# Patient Record
Sex: Male | Born: 1995 | Race: Black or African American | Hispanic: No | Marital: Single | Smoking: Never smoker
Health system: Southern US, Community
[De-identification: ages and names within clinical notes are randomized; demographics above are authoritative.]

## PROBLEM LIST (undated history)

## (undated) HISTORY — PX: ARTHROSCOPIC REPAIR PCL: SUR81

---

## 2019-02-07 ENCOUNTER — Emergency Department (HOSPITAL_COMMUNITY)
Admission: EM | Admit: 2019-02-07 | Discharge: 2019-02-07 | Disposition: A | Discharge status: Home / Self Care | Payer: Self-pay | Attending: Emergency Medicine | Admitting: Emergency Medicine

## 2019-02-07 ENCOUNTER — Other Ambulatory Visit: Payer: Self-pay

## 2019-02-07 ENCOUNTER — Encounter (HOSPITAL_COMMUNITY): Payer: Self-pay | Admitting: Emergency Medicine

## 2019-02-07 DIAGNOSIS — Z209 Contact with and (suspected) exposure to unspecified communicable disease: Secondary | ICD-10-CM | POA: Insufficient documentation

## 2019-02-07 DIAGNOSIS — J069 Acute upper respiratory infection, unspecified: Secondary | ICD-10-CM | POA: Insufficient documentation

## 2019-02-07 DIAGNOSIS — B9789 Other viral agents as the cause of diseases classified elsewhere: Secondary | ICD-10-CM

## 2019-02-07 NOTE — Discharge Instructions (Addendum)
Your vital signs are within normal limits at this time.  Your history and your examination suggest an upper respiratory infection with cough.  The recommendation from the Centers for Disease Control is that you quarantine yourself over the next 10 to 14 days.  Please wash hands frequently.  Use your mask until symptoms have resolved.  Monitor your temperature closely.  Please see your doctor or return to the emergency department if any difficulty with breathing, fever that would not respond to Tylenol or ibuprofen, excessive weakness, worsening of symptoms, problems or concerns.

## 2019-02-07 NOTE — ED Provider Notes (Signed)
[facility] EMERGENCY DEPARTMENT Provider Note   CSN: [id number] Arrival date & time: 02/07/19  1247    History   Chief Complaint Chief Complaint  Patient presents with  . Cough    HPI Justin Vance is a 23 y.o. male.     The history is provided by the patient.  Cough  Cough characteristics:  Productive Sputum characteristics:  Yellow Severity:  Moderate Onset quality:  Gradual Duration:  6 days Timing:  Intermittent Progression:  Worsening Chronicity:  New Smoker: no   Context: sick contacts   Relieved by:  Nothing Worsened by:  Nothing Ineffective treatments:  None tried Associated symptoms: fever, myalgias and sinus congestion   Associated symptoms: no chest pain, no chills, no eye discharge, no shortness of breath, no sore throat and no wheezing   Associated symptoms comment:  Chest pressure Risk factors: no recent travel     History reviewed. No pertinent past medical history.  There are no active problems to display for this patient.   Past Surgical History:  Procedure Laterality Date  . ARTHROSCOPIC REPAIR PCL          Home Medications    Prior to Admission medications   Not on File    Family History History reviewed. No pertinent family history.  Social History Social History   Tobacco Use  . Smoking status: Never Smoker  . Smokeless tobacco: Never Used  Substance Use Topics  . Alcohol use: Never    Frequency: Never  . Drug use: Never     Allergies   Patient has no known allergies.   Review of Systems Review of Systems  Constitutional: Positive for fever. Negative for activity change and chills.       All ROS Neg except as noted in HPI  HENT: Positive for congestion. Negative for nosebleeds and sore throat.   Eyes: Negative for photophobia and discharge.  Respiratory: Positive for cough. Negative for shortness of breath and wheezing.   Cardiovascular: Negative for chest pain and palpitations.  Gastrointestinal: Positive for  nausea. Negative for abdominal pain, blood in stool, diarrhea and vomiting.  Genitourinary: Negative for dysuria, frequency and hematuria.  Musculoskeletal: Positive for myalgias. Negative for arthralgias, back pain and neck pain.  Skin: Negative.   Neurological: Negative for dizziness, seizures and speech difficulty.  Psychiatric/Behavioral: Negative for confusion and hallucinations.     Physical Exam Updated Vital Signs BP 122/71 (BP Location: Left Arm)   Pulse 88   Temp 98.4 F (36.9 C) (Oral)   Resp 16   Ht 5\' 11"  (1.803 m)   Wt 71.7 kg   SpO2 100%   BMI 22.04 kg/m   Physical Exam Vitals signs and nursing note reviewed.  Constitutional:      Appearance: He is well-developed. He is not toxic-appearing.  HENT:     Head: Normocephalic.     Right Ear: Tympanic membrane and external ear normal.     Left Ear: Tympanic membrane and external ear normal.     Nose: Congestion present.  Eyes:     General: Lids are normal.     Pupils: Pupils are equal, round, and reactive to light.  Neck:     Musculoskeletal: Normal range of motion and neck supple.     Vascular: No carotid bruit.  Cardiovascular:     Rate and Rhythm: Normal rate and regular rhythm.     Pulses: Normal pulses.     Heart sounds: Normal heart sounds.  Pulmonary:  Effort: No respiratory distress.     Breath sounds: Normal breath sounds.  Abdominal:     General: Bowel sounds are normal.     Palpations: Abdomen is soft.     Tenderness: There is no abdominal tenderness. There is no guarding.  Musculoskeletal: Normal range of motion.  Lymphadenopathy:     Head:     Right side of head: No submandibular adenopathy.     Left side of head: No submandibular adenopathy.     Cervical: No cervical adenopathy.  Skin:    General: Skin is warm and dry.  Neurological:     Mental Status: He is alert and oriented to person, place, and time.     Cranial Nerves: No cranial nerve deficit.     Sensory: No sensory deficit.   Psychiatric:        Speech: Speech normal.      ED Treatments / Results  Labs (all labs ordered are listed, but only abnormal results are displayed) Labs Reviewed - No data to display  EKG None  Radiology No results found.  Procedures Procedures (including critical care time)  Medications Ordered in ED Medications - No data to display   Initial Impression / Assessment and Plan / ED Course  I have reviewed the triage vital signs and the nursing notes.  Pertinent labs & imaging results that were available during my care of the patient were reviewed by me and considered in my medical decision making (see chart for details).          Final Clinical Impressions(s) / ED Diagnoses MDM  Patient gives history of having had nasal congestion and cough.  He is not measured a temperature elevation at this time.  He says that he this is been going on over the last 6 days.  I have discussed the findings on his examination with the patient in terms of which he understands.  I asked the patient to self quarantine over the next 10 to 14 days.  We discussed the importance of using his mask until symptoms have resolved.  We have also discussed the importance of good hydration.  The patient will use Tylenol every 4 hours or ibuprofen every 6 hours for fever, and/or aching.  Patient is to return to the emergency department immediately if any changes in his condition, worsening of his symptoms, problems, or concerns.   Final diagnoses:  Viral URI with cough    ED Discharge Orders    None       Ivery Quale, PA-C 02/08/19 3154    Pricilla Loveless, MD 02/08/19 [phone]

## 2019-02-07 NOTE — ED Triage Notes (Signed)
PT c/o cough with productive yellow sputum cough with no fever and nasal congestion x6 days. PT states he works at [employer] center and was told he needed to see a doctor to get a work note to be off work with pay.

## 2019-02-19 ENCOUNTER — Telehealth: Payer: Self-pay | Admitting: General Practice

## 2019-02-19 NOTE — Telephone Encounter (Signed)
Pt. Calling to ask if [facility] is performing corona virus testing. Told that [facility] is currently not testing. States his employer sent him home because he was "fatigued and they told me to get tested." States he has some "congestion in the back of my throat and I feel sick." Reassured pt. He could go to an UC for treatment if he feels ill. Verbalizes understanding.

## 2019-02-23 ENCOUNTER — Encounter (HOSPITAL_COMMUNITY): Payer: Self-pay | Admitting: Emergency Medicine

## 2019-02-23 ENCOUNTER — Emergency Department (HOSPITAL_COMMUNITY)
Admission: EM | Admit: 2019-02-23 | Discharge: 2019-02-23 | Disposition: A | Discharge status: Home / Self Care | Payer: Self-pay | Attending: Emergency Medicine | Admitting: Emergency Medicine

## 2019-02-23 ENCOUNTER — Other Ambulatory Visit: Payer: Self-pay

## 2019-02-23 DIAGNOSIS — B9789 Other viral agents as the cause of diseases classified elsewhere: Secondary | ICD-10-CM

## 2019-02-23 DIAGNOSIS — J069 Acute upper respiratory infection, unspecified: Secondary | ICD-10-CM | POA: Insufficient documentation

## 2019-02-23 NOTE — Discharge Instructions (Addendum)
You have minimal nasal congestion present.  Please use the decongesting medication of your choice to assist with this.  There is mild increased redness of the back of your throat.  Please use salt water gargles 2 or 3 times daily.  Use Tylenol every 4 hours or ibuprofen every 6 hours if needed for aching.  Please use your mask until the symptoms have resolved.  Wash your hands frequently.  Wipe off surfaces as needed.  Please see work note.

## 2019-02-23 NOTE — ED Provider Notes (Signed)
[facility] EMERGENCY DEPARTMENT Provider Note   CSN: [id number] Arrival date & time: 02/23/19  1437    History   Chief Complaint Chief Complaint  Patient presents with  . Cough  . Sore Throat    HPI Justin Vance is a 23 y.o. male.     Patient is a 23 year old male who presents to the emergency department with a complaint of continued cough and congestion.  The patient was seen in the emergency department approximately 2 to 3 weeks ago.  At which time he had upper respiratory symptoms.  He was quarantined for 14 days.  He states that on the 14th day when he went back to work he was still coughing and bringing up thick yellowish phlegm.  He also had problems with some sore throat.Marland Kitchen  His job sent him home again.  The patient states that the following day he felt very bad, but 2 days after that his return to work he felt back to himself.  He has now only minimal cough.  His throat is much better.  He is eating and drinking with minimal problem.  He does continue to have some mild nasal congestion.  The history is provided by the patient.  Cough  Associated symptoms: sore throat   Associated symptoms: no chest pain, no eye discharge, no shortness of breath and no wheezing   Sore Throat  Pertinent negatives include no chest pain, no abdominal pain and no shortness of breath.    History reviewed. No pertinent past medical history.  There are no active problems to display for this patient.   Past Surgical History:  Procedure Laterality Date  . ARTHROSCOPIC REPAIR PCL          Home Medications    Prior to Admission medications   Not on File    Family History History reviewed. No pertinent family history.  Social History Social History   Tobacco Use  . Smoking status: Never Smoker  . Smokeless tobacco: Never Used  Substance Use Topics  . Alcohol use: Never    Frequency: Never  . Drug use: Never     Allergies   Patient has no known allergies.   Review of  Systems Review of Systems  Constitutional: Negative for activity change.       All ROS Neg except as noted in HPI  HENT: Positive for congestion, postnasal drip and sore throat. Negative for nosebleeds.   Eyes: Negative for photophobia and discharge.  Respiratory: Negative for cough, shortness of breath and wheezing.   Cardiovascular: Negative for chest pain and palpitations.  Gastrointestinal: Negative for abdominal pain and blood in stool.  Genitourinary: Negative for dysuria, frequency and hematuria.  Musculoskeletal: Negative for arthralgias, back pain and neck pain.  Skin: Negative.   Neurological: Negative for dizziness, seizures and speech difficulty.  Psychiatric/Behavioral: Negative for confusion and hallucinations.     Physical Exam Updated Vital Signs BP 132/90 (BP Location: Right Arm)   Pulse 60   Temp 98.6 F (37 C) (Oral)   Resp 14   Ht 5\' 11"  (1.803 m)   Wt 72 kg   SpO2 100%   BMI 22.14 kg/m   Physical Exam Vitals signs and nursing note reviewed.  Constitutional:      Appearance: He is well-developed. He is not toxic-appearing.  HENT:     Head: Normocephalic.     Comments: There is nasal congestion present.  There is mild increased redness of the posterior pharynx.  No exudate appreciated.  The airway is patent.  Uvula minimally swollen, but in the midline.    Right Ear: Tympanic membrane and external ear normal.     Left Ear: Tympanic membrane and external ear normal.     Nose: Congestion present.  Eyes:     General: Lids are normal.     Pupils: Pupils are equal, round, and reactive to light.  Neck:     Musculoskeletal: Normal range of motion and neck supple.     Vascular: No carotid bruit.  Cardiovascular:     Rate and Rhythm: Normal rate and regular rhythm.     Pulses: Normal pulses.     Heart sounds: Normal heart sounds.  Pulmonary:     Effort: No respiratory distress.     Breath sounds: Normal breath sounds.  Abdominal:     General: Bowel  sounds are normal.     Palpations: Abdomen is soft.     Tenderness: There is no abdominal tenderness. There is no guarding.  Musculoskeletal: Normal range of motion.  Lymphadenopathy:     Head:     Right side of head: No submandibular adenopathy.     Left side of head: No submandibular adenopathy.     Cervical: No cervical adenopathy.  Skin:    General: Skin is warm and dry.  Neurological:     Mental Status: He is alert and oriented to person, place, and time.     Cranial Nerves: No cranial nerve deficit.     Sensory: No sensory deficit.  Psychiatric:        Speech: Speech normal.      ED Treatments / Results  Labs (all labs ordered are listed, but only abnormal results are displayed) Labs Reviewed - No data to display  EKG None  Radiology No results found.  Procedures Procedures (including critical care time)  Medications Ordered in ED Medications - No data to display   Initial Impression / Assessment and Plan / ED Course  I have reviewed the triage vital signs and the nursing notes.  Pertinent labs & imaging results that were available during my care of the patient were reviewed by me and considered in my medical decision making (see chart for details).          Final Clinical Impressions(s) / ED Diagnoses MDM  Vital signs within normal limits.  Pulse oximetry is 100% on room air.  Within normal limits by my interpretation.  The examination shows minimal nasal congestion present.  The patient has some mild increased redness of the posterior pharynx.  He is awake and alert and ambulatory without problem.  The patient has quarantined for 14 and then 4 another 4 days.  I have asked the patient to use medications for his symptoms.  We will give him a note to return to work on April 15.  The patient is advised however that if he has fever, chills, shortness of breath, chest discomfort, or worsening of his general condition, that he should come back to the emergency  department and not return to work.  We discussed the importance of continued good handwashing and good hydration.  We also discussed the need to continue the use of his mask.  Patient is in agreement with these plans.   Final diagnoses:  Viral URI with cough    ED Discharge Orders    None       Ivery QualeBryant, Tona Qualley, PA-C 02/23/19 1611    Benjiman CorePickering, Nathan, MD 02/23/19 2226

## 2019-02-23 NOTE — ED Triage Notes (Signed)
Pt states that he has been having chills cough sore throat he was dx with a uri on 02/07/2019

## 2019-04-27 ENCOUNTER — Emergency Department (HOSPITAL_COMMUNITY)
Admission: EM | Admit: 2019-04-27 | Discharge: 2019-04-27 | Disposition: A | Discharge status: Home / Self Care | Payer: Self-pay | Attending: Emergency Medicine | Admitting: Emergency Medicine

## 2019-04-27 ENCOUNTER — Other Ambulatory Visit: Payer: Self-pay

## 2019-04-27 DIAGNOSIS — R1084 Generalized abdominal pain: Secondary | ICD-10-CM | POA: Insufficient documentation

## 2019-04-27 DIAGNOSIS — K5900 Constipation, unspecified: Secondary | ICD-10-CM | POA: Insufficient documentation

## 2019-04-27 MED ORDER — FAMOTIDINE 20 MG PO TABS
20.0000 mg | ORAL_TABLET | Freq: Once | ORAL | Status: AC
  Administered 2019-04-27: 20 mg via ORAL
  Filled 2019-04-27: qty 1

## 2019-04-27 MED ORDER — MAGNESIUM HYDROXIDE 400 MG/5ML PO SUSP
30.0000 mL | Freq: Once | ORAL | Status: AC
  Administered 2019-04-27: 30 mL via ORAL
  Filled 2019-04-27: qty 30

## 2019-04-27 MED ORDER — POLYETHYLENE GLYCOL 3350 17 G PO PACK: 17.0000 g | PACK | Freq: Every day | ORAL | 0 refills | Status: DC

## 2019-04-27 MED ORDER — PANTOPRAZOLE SODIUM 40 MG PO TBEC
40.0000 mg | DELAYED_RELEASE_TABLET | Freq: Once | ORAL | Status: AC
  Administered 2019-04-27: 40 mg via ORAL
  Filled 2019-04-27: qty 1

## 2019-04-27 NOTE — ED Provider Notes (Signed)
[facility] EMERGENCY DEPARTMENT Provider Note   CSN: [id number] Arrival date & time: 04/27/19  7001     History   Chief Complaint Chief Complaint  Patient presents with  . Abdominal Pain    HPI Justin Vance is a 23 y.o. male.     Patient is a 23 year old male who presents to the emergency department with abdominal pain.  The patient states that this problem started Friday, June 12.  The patient states that prior to that time he is not been eating a consistent diet.  He he says that on Friday he had some very minor pain in his abdomen.  He had a couple of pieces of pizza bread stick, but that was pretty much it as far as his dinner was concerned.  Later during the night he had increased bubbling of his stomach.  This bubbling continued on Sunday and was accompanied by an occasional cramp.  On today, June 15 the patient states that not only is he having bubbling of his stomach but he is having more severe cramping.  No vomiting reported.  No fever noted.  He had a mushy stool on 21 April 2011, but has not had a bowel movement since that time.  He does not have a history of irritable bowel syndrome.  He admits that he does not drink much water.  He has not had injury to the abdomen and no recent operations or procedures.  No blood in the stool reported, no black or tarry looking stool reported.     No past medical history on file.  There are no active problems to display for this patient.   Past Surgical History:  Procedure Laterality Date  . ARTHROSCOPIC REPAIR PCL          Home Medications    Prior to Admission medications   Not on File    Family History No family history on file.  Social History Social History   Tobacco Use  . Smoking status: Never Smoker  . Smokeless tobacco: Never Used  Substance Use Topics  . Alcohol use: Never    Frequency: Never  . Drug use: Never     Allergies   Patient has no known allergies.   Review of Systems Review of Systems   Constitutional: Negative for activity change.       All ROS Neg except as noted in HPI  HENT: Negative for nosebleeds.   Eyes: Negative for photophobia and discharge.  Respiratory: Negative for cough, shortness of breath and wheezing.   Cardiovascular: Negative for chest pain and palpitations.  Gastrointestinal: Positive for abdominal pain and constipation. Negative for blood in stool.  Genitourinary: Negative for dysuria, frequency and hematuria.  Musculoskeletal: Negative for arthralgias, back pain and neck pain.  Skin: Negative.   Neurological: Negative for dizziness, seizures and speech difficulty.  Psychiatric/Behavioral: Negative for confusion and hallucinations.     Physical Exam Updated Vital Signs BP 119/78 (BP Location: Right Arm)   Pulse (!) 50   Temp 98 F (36.7 C) (Oral)   Resp 18   Ht 5' 10.5" (1.791 m)   Wt 68 kg   SpO2 100%   BMI 21.22 kg/m   Physical Exam Vitals signs and nursing note reviewed.  Constitutional:      Appearance: He is well-developed. He is not toxic-appearing.  HENT:     Head: Normocephalic.     Right Ear: Tympanic membrane and external ear normal.     Left Ear: Tympanic membrane and  external ear normal.  Eyes:     General: Lids are normal.     Pupils: Pupils are equal, round, and reactive to light.  Neck:     Musculoskeletal: Normal range of motion and neck supple.     Vascular: No carotid bruit.  Cardiovascular:     Rate and Rhythm: Normal rate and regular rhythm.     Pulses: Normal pulses.     Heart sounds: Normal heart sounds.  Pulmonary:     Effort: No respiratory distress.     Breath sounds: Normal breath sounds.  Abdominal:     General: Bowel sounds are normal.     Palpations: Abdomen is soft.     Tenderness: There is generalized abdominal tenderness. There is no right CVA tenderness, left CVA tenderness or guarding. Negative signs include Murphy's sign, McBurney's sign and psoas sign.     Comments: Mild generalized  tenderness.  Increase gas throughout the abdomen.  No pain with flexion of the psoas muscle.  No pain with walking.  No CVA tenderness.  Musculoskeletal: Normal range of motion.  Lymphadenopathy:     Head:     Right side of head: No submandibular adenopathy.     Left side of head: No submandibular adenopathy.     Cervical: No cervical adenopathy.  Skin:    General: Skin is warm and dry.  Neurological:     Mental Status: He is alert and oriented to person, place, and time.     Cranial Nerves: No cranial nerve deficit.     Sensory: No sensory deficit.  Psychiatric:        Speech: Speech normal.      ED Treatments / Results  Labs (all labs ordered are listed, but only abnormal results are displayed) Labs Reviewed - No data to display  EKG    Radiology No results found.  Procedures Procedures (including critical care time)  Medications Ordered in ED Medications - No data to display   Initial Impression / Assessment and Plan / ED Course  I have reviewed the triage vital signs and the nursing notes.  Pertinent labs & imaging results that were available during my care of the patient were reviewed by me and considered in my medical decision making (see chart for details).          Final Clinical Impressions(s) / ED Diagnoses MDM  Vital signs are within normal limits.  Pulse oximetry is 99 to 100% on room air.  Patient is awake and alert and in no distress.  Patient is amatory without pain.  There is mild to moderate generalized pain accompanied by increased bubbling of his stomach and extra gas.  There is no pain with flexing of the so as, walking, or other signs of surgical abdomen.  The patient is treated with milk of magnesia here in the emergency department for suspected constipation.  The patient is also given Pepcid and Protonix here in the emergency department.  Prescription for MiraLAX is given.  Patient is asked to use Pepcid twice daily before his meals.   Patient is asked to see his primary physician or return to the emergency department if any changes in his condition, problems, or concerns.   Final diagnoses:  Generalized abdominal pain  Constipation, unspecified constipation type    ED Discharge Orders         Ordered    polyethylene glycol (MIRALAX / GLYCOLAX) 17 g packet  Daily     04/27/19 1035  Ivery QualeBryant, Manveer Gomes, PA-C 04/27/19 2129    Vanetta MuldersZackowski, Scott, MD 04/28/19 [phone]

## 2019-04-27 NOTE — Discharge Instructions (Addendum)
Your vital signs within normal limits.  Your oxygen level is 100% on room air.  Please increase water, juices, Gatorade, etc.  Please use MiraLAX, 1 packet, or 17 g daily in water or juice to better regulate your bowels.  Please use Pepcid 20 mg 2 times daily before breakfast and before your evening meal.  Please see your primary physician or return to the emergency department immediately if any high fever, excessive vomiting, blood in your stool, worsening of your symptoms, changes in your condition, problems, or concerns.

## 2019-04-27 NOTE — ED Triage Notes (Signed)
Presents with abdominal pain described as cramping that began this AM. He states, "It felt like I had to have a BM" He has not had a BM since Friday. He usually goes daily.

## 2019-04-27 NOTE — ED Notes (Signed)
Unable to sign due to e signature problem

## 2020-08-10 ENCOUNTER — Telehealth (HOSPITAL_COMMUNITY): Payer: Self-pay | Admitting: Emergency Medicine

## 2020-08-10 ENCOUNTER — Emergency Department (HOSPITAL_COMMUNITY): Payer: Self-pay

## 2020-08-10 ENCOUNTER — Other Ambulatory Visit: Payer: Self-pay

## 2020-08-10 ENCOUNTER — Emergency Department (HOSPITAL_COMMUNITY)
Admission: EM | Admit: 2020-08-10 | Discharge: 2020-08-10 | Disposition: A | Discharge status: Home / Self Care | Payer: Self-pay | Attending: Emergency Medicine | Admitting: Emergency Medicine

## 2020-08-10 ENCOUNTER — Encounter (HOSPITAL_COMMUNITY): Payer: Self-pay

## 2020-08-10 DIAGNOSIS — N5089 Other specified disorders of the male genital organs: Secondary | ICD-10-CM | POA: Insufficient documentation

## 2020-08-10 DIAGNOSIS — N50812 Left testicular pain: Secondary | ICD-10-CM

## 2020-08-10 DIAGNOSIS — N3000 Acute cystitis without hematuria: Secondary | ICD-10-CM

## 2020-08-10 DIAGNOSIS — N50819 Testicular pain, unspecified: Secondary | ICD-10-CM

## 2020-08-10 LAB — URINALYSIS, ROUTINE W REFLEX MICROSCOPIC
Bacteria, UA: NONE SEEN
Bilirubin Urine: NEGATIVE
Glucose, UA: NEGATIVE mg/dL
Hgb urine dipstick: NEGATIVE
Ketones, ur: NEGATIVE mg/dL
Nitrite: NEGATIVE
Protein, ur: 30 mg/dL — AB
Specific Gravity, Urine: 1.028 (ref 1.005–1.030)
WBC, UA: >50 WBC/hpf — ABNORMAL HIGH (ref 0–5)
pH: 6 (ref 5.0–8.0)

## 2020-08-10 MED ORDER — HYDROCODONE-ACETAMINOPHEN 5-325 MG PO TABS: 2.0000 | ORAL_TABLET | ORAL | 0 refills | Status: DC | PRN

## 2020-08-10 MED ORDER — DOXYCYCLINE HYCLATE 100 MG PO CAPS: 100.0000 mg | ORAL_CAPSULE | Freq: Two times a day (BID) | ORAL | 0 refills | Status: DC

## 2020-08-10 MED ORDER — CEFTRIAXONE SODIUM 500 MG IJ SOLR
500.0000 mg | Freq: Once | INTRAMUSCULAR | Status: AC
  Administered 2020-08-10: 500 mg via INTRAMUSCULAR
  Filled 2020-08-10: qty 500

## 2020-08-10 MED ORDER — LIDOCAINE HCL (PF) 1 % IJ SOLN
INTRAMUSCULAR | Status: AC
  Filled 2020-08-10: qty 30

## 2020-08-10 MED ORDER — HYDROCODONE-ACETAMINOPHEN 5-325 MG PO TABS: 1.0000 | ORAL_TABLET | ORAL | 0 refills | Status: DC | PRN

## 2020-08-10 NOTE — ED Provider Notes (Signed)
[facility] EMERGENCY DEPARTMENT Provider Note   CSN: [id number] Arrival date & time: 08/10/20  1131     History Chief Complaint  Patient presents with  . Testicle Pain    Melroy Bougher is a 24 y.o. male.  Patient is a 24 year old male with no significant past medical history.  He presents today for evaluation of pain and swelling in his left testicle.  He denies any specific injury or trauma.  This has been worsening over the past 2 weeks.  He was seen by an outside clinic and prescribed Macrodantin for UTI.  He denies to me he is experiencing urethral discharge.  He does report occasional burning when he urinates.  He denies any fevers or chills.  He denies any new sexual contacts or exposures.  The history is provided by the patient.  Testicle Pain This is a new problem. Episode onset: Weeks ago. The problem occurs constantly. The problem has been gradually worsening. The symptoms are aggravated by walking (Palpation). Nothing relieves the symptoms. He has tried nothing for the symptoms.       History reviewed. No pertinent past medical history.  There are no problems to display for this patient.   Past Surgical History:  Procedure Laterality Date  . ARTHROSCOPIC REPAIR PCL         No family history on file.  Social History   Tobacco Use  . Smoking status: Never Smoker  . Smokeless tobacco: Never Used  Vaping Use  . Vaping Use: Never used  Substance Use Topics  . Alcohol use: Never  . Drug use: Never    Home Medications Prior to Admission medications   Medication Sig Start Date End Date Taking? Authorizing Provider  polyethylene glycol (MIRALAX / GLYCOLAX) 17 g packet Take 17 g by mouth daily. 04/27/19   Ivery Quale, PA-C    Allergies    Patient has no known allergies.  Review of Systems   Review of Systems  Genitourinary: Positive for testicular pain.  All other systems reviewed and are negative.   Physical Exam Updated Vital Signs BP 128/62    Pulse 71   Temp (!) 97 F (36.1 C) (Oral)   Resp 16   Ht 5\' 10"  (1.778 m)   Wt 65.8 kg   SpO2 100%   BMI 20.81 kg/m   Physical Exam Vitals and nursing note reviewed.  Constitutional:      General: He is not in acute distress.    Appearance: Normal appearance. He is not ill-appearing, toxic-appearing or diaphoretic.  HENT:     Head: Normocephalic and atraumatic.  Pulmonary:     Effort: Pulmonary effort is normal.  Genitourinary:    Penis: Normal.      Comments: Both testicles appear swollen and tender, the left greater than the right.  They are freely mobile within the scrotum.  There is no urethral discharge or lesions of the penis or testicles. Skin:    General: Skin is warm and dry.  Neurological:     Mental Status: He is alert and oriented to person, place, and time.     ED Results / Procedures / Treatments   Labs (all labs ordered are listed, but only abnormal results are displayed) Labs Reviewed  URINALYSIS, ROUTINE W REFLEX MICROSCOPIC  GC/CHLAMYDIA PROBE AMP ([place]) NOT AT [facility]    EKG None  Radiology No results found.  Procedures Procedures (including critical care time)  Medications Ordered in ED Medications - No data to display  ED  Course  I have reviewed the triage vital signs and the nursing notes.  Pertinent labs & imaging results that were available during my care of the patient were reviewed by me and considered in my medical decision making (see chart for details).    MDM Rules/Calculators/A&P  Patient is a 24 year old male presenting with complaints of testicular pain and swelling.  He was seen earlier this week with similar complaints at an outside facility and prescribed Macrodantin, however this is not helping.  Patient's testicles are swollen and tender, but freely mobile.  There is no evidence for torsion on the ultrasound.  It does show moderate bilateral hydroceles, with the left showing multiple septations raising question of  prior hemorrhage or infection.  Patient does have evidence for UTI in his urine and I favor an infectious etiology.  Patient will be given IM Rocephin and discharged with a several week course of doxycycline.  I have also advised him to follow-up with urology in the next week, and return to the ER if symptoms worsen or change.  Final Clinical Impression(s) / ED Diagnoses Final diagnoses:  None    Rx / DC Orders ED Discharge Orders    None       Geoffery Lyons, MD 08/10/20 1430

## 2020-08-10 NOTE — Discharge Instructions (Addendum)
Begin taking doxycycline as prescribed.  Take hydrocodone as prescribed as needed for pain.  We will call you if your cultures indicate you require further treatment or action.  You should follow-up with urology in the next week.  The contact information for [facility] in [place] has been provided in this discharge summary for you to call and make these arrangements.  Return to the ER in the meantime if symptoms significantly worsen or change.

## 2020-08-10 NOTE — ED Triage Notes (Signed)
Pt presents to ED with testicle pain x couple weeks and swelling. Pt states he seen his PCP and was treated for UTI and then went back but was unable to "afford the other tests they wanted done."

## 2020-08-11 LAB — GC/CHLAMYDIA PROBE AMP (~~LOC~~) NOT AT ARMC
Chlamydia: POSITIVE — AB
Comment: NEGATIVE
Comment: NORMAL
Neisseria Gonorrhea: NEGATIVE

## 2020-09-18 ENCOUNTER — Encounter (HOSPITAL_COMMUNITY): Payer: Self-pay | Admitting: *Deleted

## 2020-09-18 ENCOUNTER — Emergency Department (HOSPITAL_COMMUNITY)
Admission: EM | Admit: 2020-09-18 | Discharge: 2020-09-18 | Disposition: A | Discharge status: Home / Self Care | Payer: Self-pay | Attending: Emergency Medicine | Admitting: Emergency Medicine

## 2020-09-18 DIAGNOSIS — R300 Dysuria: Secondary | ICD-10-CM | POA: Insufficient documentation

## 2020-09-18 LAB — URINALYSIS, ROUTINE W REFLEX MICROSCOPIC
Bilirubin Urine: NEGATIVE
Glucose, UA: NEGATIVE mg/dL
Hgb urine dipstick: NEGATIVE
Ketones, ur: NEGATIVE mg/dL
Nitrite: NEGATIVE
Protein, ur: NEGATIVE mg/dL
Specific Gravity, Urine: 1.006 (ref 1.005–1.030)
pH: 6 (ref 5.0–8.0)

## 2020-09-18 NOTE — ED Notes (Signed)
Here one month ago   Given referral to urologist   Never went to appt   Here with complaint of pain that her not gotten better "since I was here"  Estimate of 6 week pain with no relief per pt report nor follow up by pt

## 2020-09-18 NOTE — Discharge Instructions (Addendum)
Please follow-up with [facility] here in [place].  Please call tomorrow morning to make an appointment.  We have collected a urine culture today as well as a another gonorrhea and chlamydia test. This will help [place] determine whether there is any ongoing infection currently. We will hold off on antibiotics until we have more information. It is vitally important for you to follow-up with urology (the testicular/penile and lower urinary tract disease doctors)

## 2020-09-18 NOTE — ED Triage Notes (Signed)
PAIN WITH URINATION

## 2020-09-18 NOTE — ED Provider Notes (Signed)
[facility] EMERGENCY DEPARTMENT Provider Note   CSN: [id number] Arrival date & time: 09/18/20  1115     History Chief Complaint  Patient presents with  . Groin Pain  . Dysuria    Justin Vance is a 24 y.o. male.  HPI Patient is 24 year old male with past medical history of chlamydia infection presented today with complaint of dysuria that occurs with the first urination of a day every day.  He states that this is been ongoing since midway through September.  He states that he was seen in the emergency room at the end of September and was treated for gonorrhea and chlamydia with discharged home with follow-up with urology which he never followed up on.  He states he took his antibiotics as prescribed he states that he was given 40 tablets of doxycycline and states that he took them as prescribed although he will occasionally miss a dose he states he took them for approximately 20 days.  He states he then followed up with his primary care doctor who had no further recommendations for him at that time.  He states that he has no significant symptoms currently other than some sensation of a "cord "in his right lower pelvis.  He states it is not significantly painful however.  Denies any dysuria other than his first episode of pain per day.  Denies any sexual contact since he was treated for chlamydia.  Denies any penile discharge, hematuria, frequency urgency. No fevers or chills. No pelvic pain.  No associated symptoms.  No aggravating or mitigating factors.     History reviewed. No pertinent past medical history.  There are no problems to display for this patient.   Past Surgical History:  Procedure Laterality Date  . ARTHROSCOPIC REPAIR PCL         No family history on file.  Social History   Tobacco Use  . Smoking status: Never Smoker  . Smokeless tobacco: Never Used  Vaping Use  . Vaping Use: Never used  Substance Use Topics  . Alcohol use: Never  . Drug use: Never      Home Medications Prior to Admission medications   Medication Sig Start Date End Date Taking? Authorizing Provider  doxycycline (VIBRAMYCIN) 100 MG capsule Take 1 capsule (100 mg total) by mouth 2 (two) times daily. One po bid x 7 days Patient not taking: Reported on 09/18/2020 08/10/20   Geoffery Lyons, MD  HYDROcodone-acetaminophen (NORCO) 5-325 MG tablet Take 1 tablet by mouth every 4 (four) hours as needed for moderate pain. Patient not taking: Reported on 09/18/2020 08/10/20   Mancel Bale, MD  HYDROcodone-acetaminophen [facility]) 5-325 MG tablet Take 1 tablet by mouth every 4 (four) hours as needed for moderate pain. Patient not taking: Reported on 09/18/2020 08/10/20   Mancel Bale, MD  polyethylene glycol (MIRALAX / GLYCOLAX) 17 g packet Take 17 g by mouth daily. Patient not taking: Reported on 09/18/2020 04/27/19   Ivery Quale, PA-C    Allergies    Patient has no known allergies.  Review of Systems   Review of Systems  Constitutional: Negative for chills and fever.  HENT: Negative for congestion.   Respiratory: Negative for shortness of breath.   Cardiovascular: Negative for chest pain.  Gastrointestinal: Negative for abdominal pain.  Genitourinary: Positive for dysuria. Negative for discharge.  Musculoskeletal: Negative for neck pain.    Physical Exam Updated Vital Signs BP 129/74 (BP Location: Right Arm)   Pulse 76   Temp 98.4 F (36.9 C) (  Oral)   Resp 16   Ht 5\' 10"  (1.778 m)   Wt 68 kg   SpO2 98%   BMI 21.52 kg/m   Physical Exam Vitals and nursing note reviewed.  Constitutional:      General: He is not in acute distress.    Appearance: Normal appearance. He is not ill-appearing.     Comments: Pleasant well-appearing 24 year old.  In no acute distress.  Sitting comfortably in bed.  Able answer questions appropriately follow commands. No increased work of breathing. Speaking in full sentences.  HENT:     Head: Normocephalic and atraumatic.  Eyes:      General: No scleral icterus.       Right eye: No discharge.        Left eye: No discharge.     Conjunctiva/sclera: Conjunctivae normal.  Pulmonary:     Effort: Pulmonary effort is normal.     Breath sounds: No stridor.  Genitourinary:    Testes: Normal.     Comments: Testes without tenderness to palpation.  Normal lie.  Intact cremasteric reflex.  Penis without any urethral meatus discharge. Neurological:     Mental Status: He is alert and oriented to person, place, and time. Mental status is at baseline.     ED Results / Procedures / Treatments   Labs (all labs ordered are listed, but only abnormal results are displayed) Labs Reviewed  URINALYSIS, ROUTINE W REFLEX MICROSCOPIC - Abnormal; Notable for the following components:      Result Value   Leukocytes,Ua TRACE (*)    Bacteria, UA RARE (*)    All other components within normal limits  URINE CULTURE  GC/CHLAMYDIA PROBE AMP ([place]) NOT AT [facility]    EKG None  Radiology No results found.  Procedures Procedures (including critical care time)  Medications Ordered in ED Medications - No data to display  ED Course  I have reviewed the triage vital signs and the nursing notes.  Pertinent labs & imaging results that were available during my care of the patient were reviewed by me and considered in my medical decision making (see chart for details).    MDM Rules/Calculators/A&P                          Patient is 24 year old male with complaint today of dysuria.  He has no other significant complaints, he does state that he can feel a "cord "in his inner thigh.  No significant pain.  He states his dysuria is only the first episode of being per day.  No hematuria.  Patient's physical exam is unremarkable he has testes with normal lie and normal cremasteric reflex.  No tenderness to palpation.  I reviewed his ultrasound of his testes from his prior visit was without any significant abnormality although infection versus  inflammatory process is not excluded.  He ended up being positive for chlamydia he was treated for gonorrhea and chlamydia.  He has not followed up with urology although he was directed to urology office.  Patient's urinalysis is unremarkable--apart from rare bacteria and trace leukocytes there is no evidence of infection and I doubt that patient has urinary tract infection causing symptoms today.  Urine culture in process and repeat gonorrhea chlamydia probe obtained NAT.  Patient discharged without antibiotics at this time will follow up with urology given return precautions.  Urine culture and GC chlamydia probe will result in the future.  I discussed this case with my attending  physician who cosigned this note including patient's presenting symptoms, physical exam, and planned diagnostics and interventions. Attending physician stated agreement with plan or made changes to plan which were implemented.   Final Clinical Impression(s) / ED Diagnoses Final diagnoses:  Dysuria    Rx / DC Orders ED Discharge Orders    None       Gailen Shelter, [place] 09/19/20 7169    Maia Plan, MD 09/19/20 1521

## 2020-09-18 NOTE — ED Notes (Signed)
Call to lab   Re: urine culture

## 2020-09-18 NOTE — ED Notes (Signed)
Pt would not stop playing w phone   DC instructions given   Referral to uro underlined for pat as well as phone number   Pt reports he has no questions as he continues on cell phone

## 2020-09-20 LAB — URINE CULTURE: Culture: <10000 — AB

## 2020-09-24 ENCOUNTER — Telehealth (HOSPITAL_COMMUNITY): Payer: Self-pay | Admitting: Physician Assistant

## 2020-09-24 MED ORDER — AZITHROMYCIN 250 MG PO TABS: 1000.0000 mg | ORAL_TABLET | Freq: Every day | ORAL | 0 refills | Status: DC

## 2020-09-24 NOTE — ED Notes (Signed)
Spoke with patient on phone after he called inquiring about results. He is positive for chlamydia. [place] Hendrly PA notified and will send in rx for antibiotics. Pt agrees and will pick up rx and follow up with health dept.

## 2020-09-24 NOTE — Telephone Encounter (Signed)
Patient seen by Dr. Jacqulyn Bath for dysuria. GC positive for Chlamydia. Will send in RX

## 2021-06-09 IMAGING — US US SCROTUM W/ DOPPLER COMPLETE
1 series · 13 of 25 positions shown · non-contrast
Comparison: None.

CLINICAL DATA: Scrotal pain and swelling

EXAM:
SCROTAL ULTRASOUND
DOPPLER ULTRASOUND OF THE TESTICLES
TECHNIQUE: Complete ultrasound examination of the testicles, epididymis, and
other scrotal structures was performed. Color and spectral Doppler
ultrasound were also utilized to evaluate blood flow to the
testicles.

[Series 1: us scrotum w/doppler · 13 of 72 slices shown]
[im 1/72]
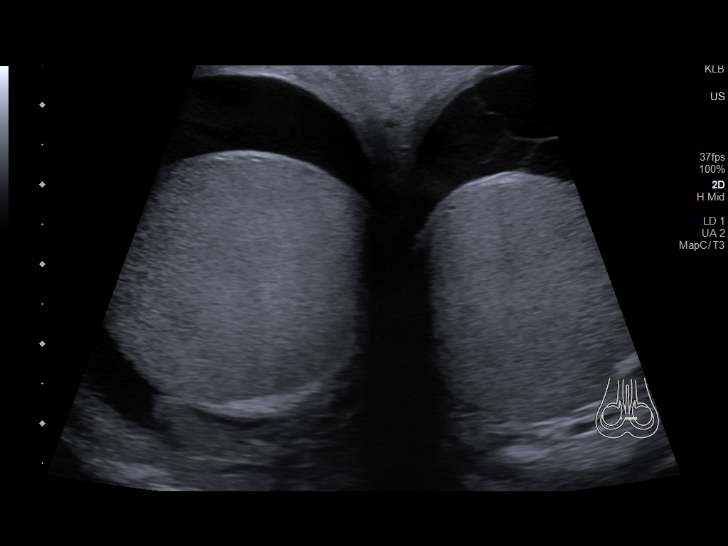
[im 6/72]
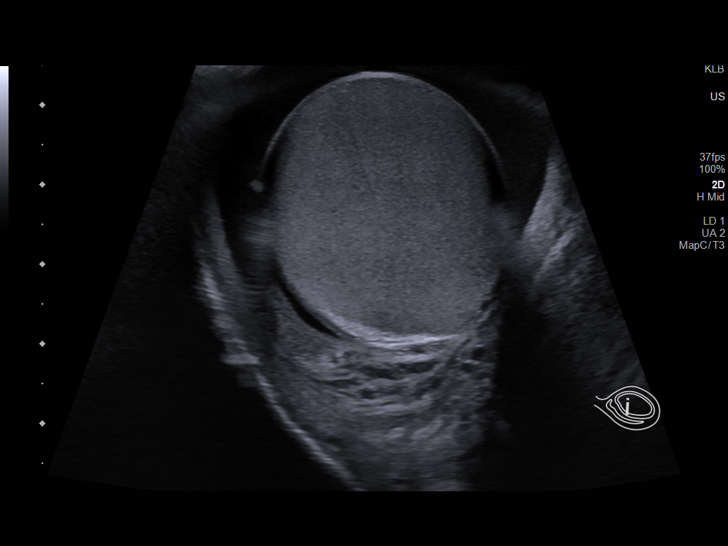
[im 12/72]
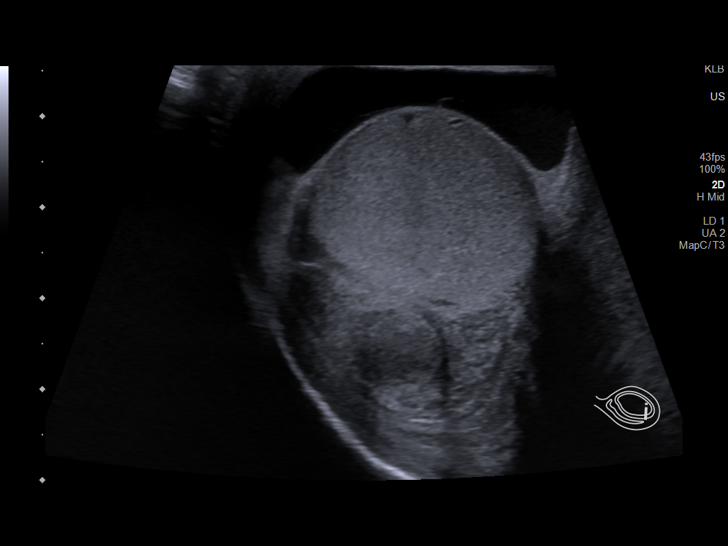
[im 18/72]
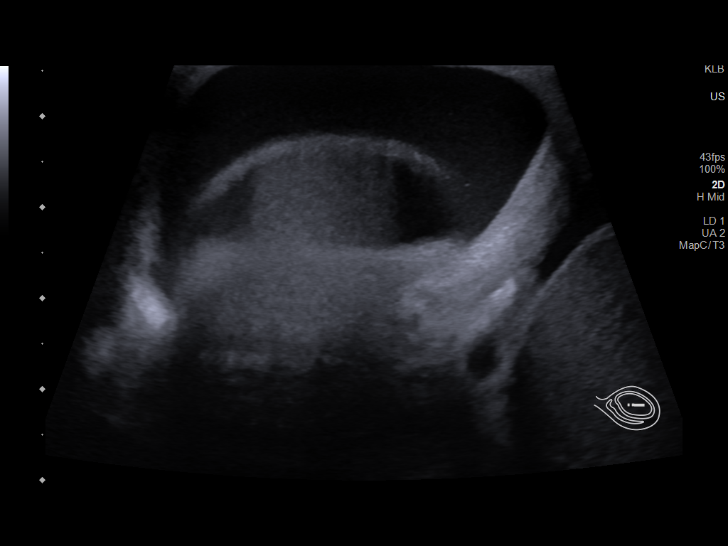
[im 24/72]
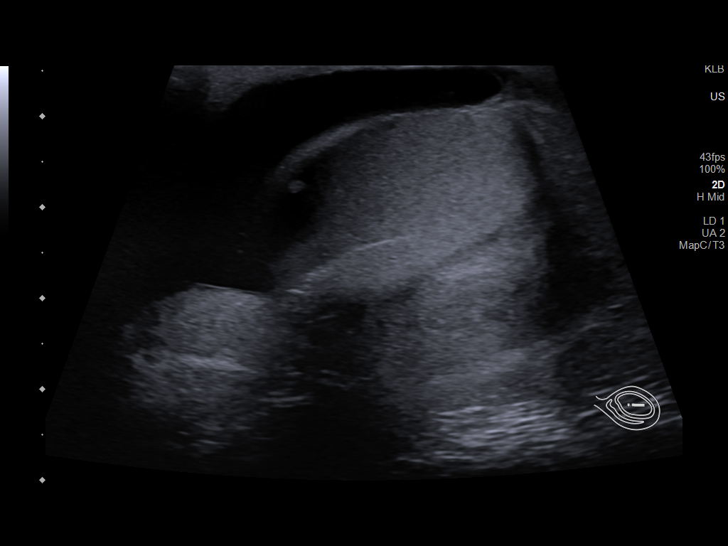
[im 30/72]
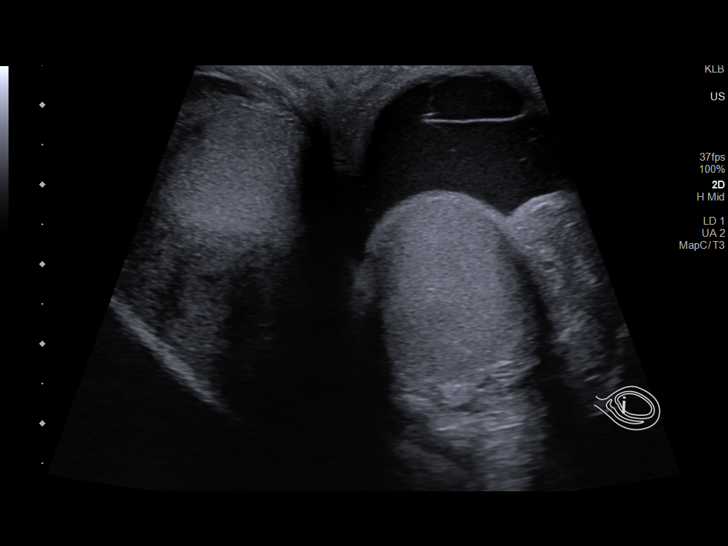
[im 36/72]
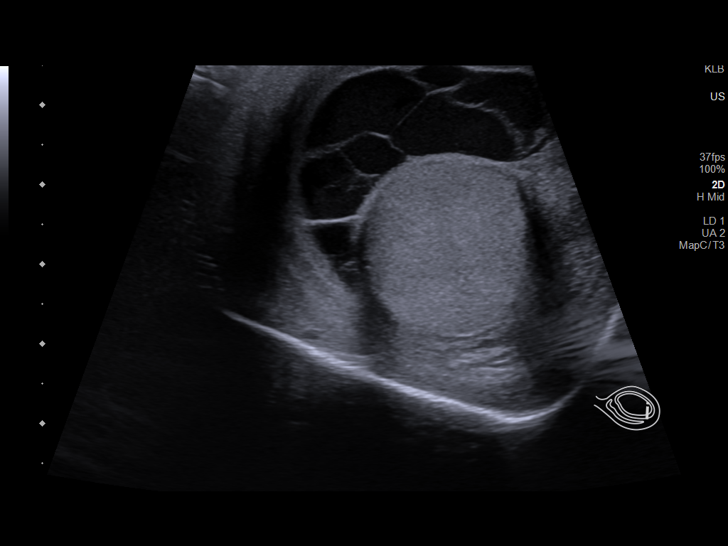
[im 42/72]
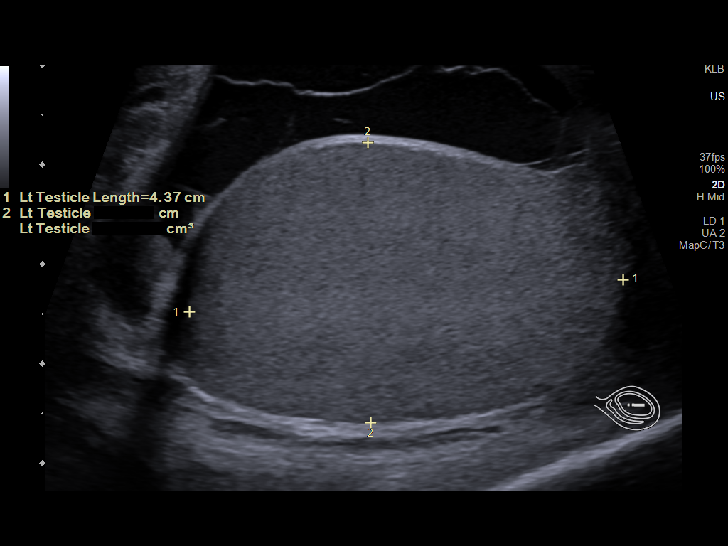
[im 48/72]
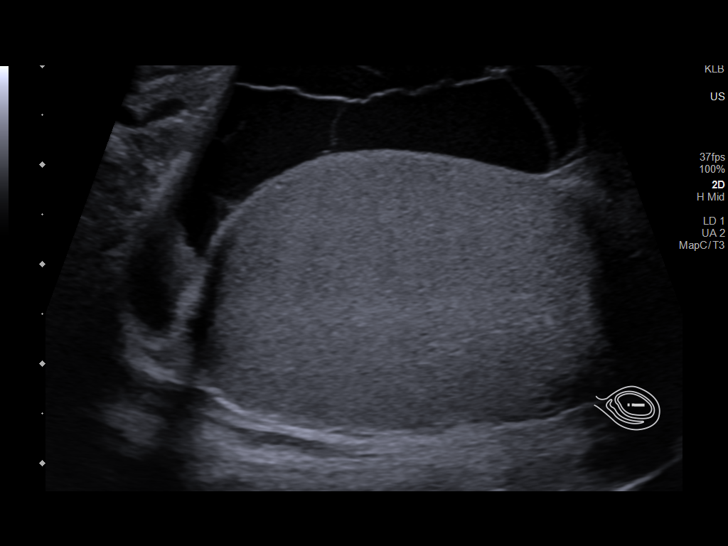
[im 54/72]
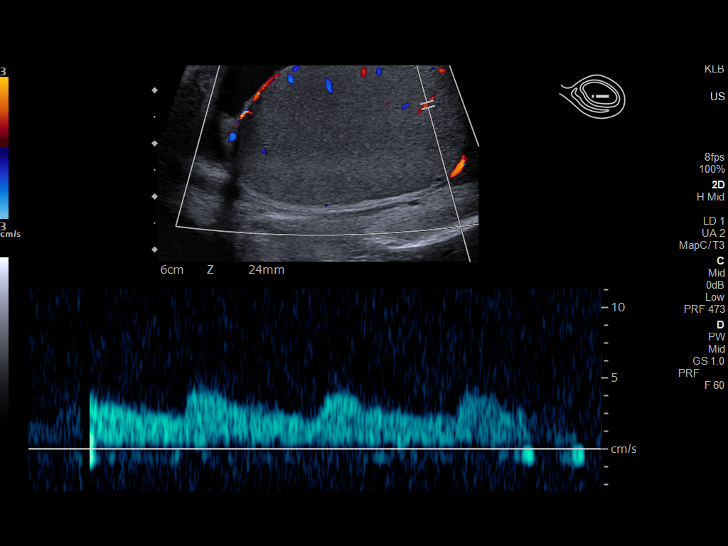
[im 60/72]
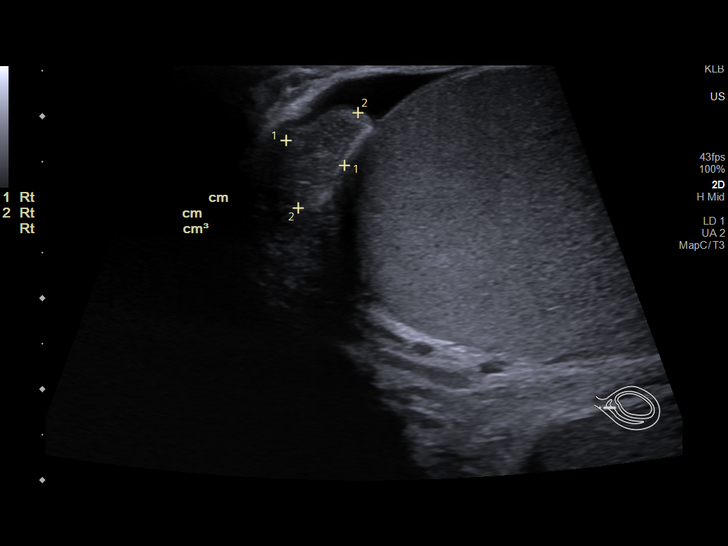
[im 66/72]
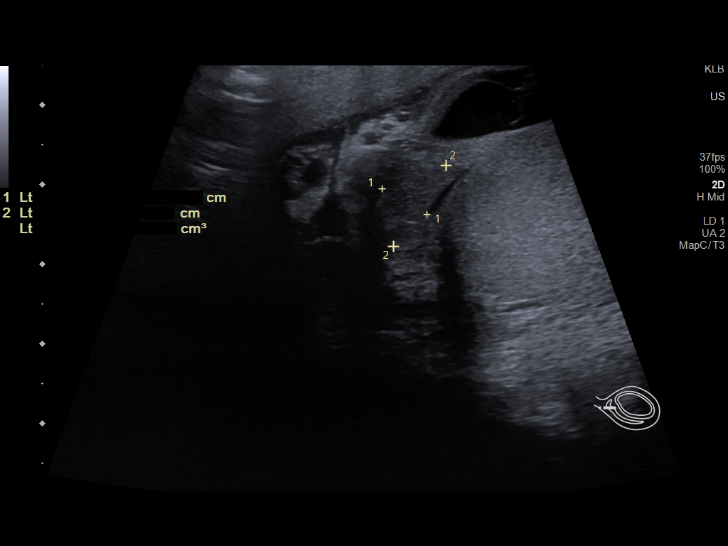
[im 72/72]
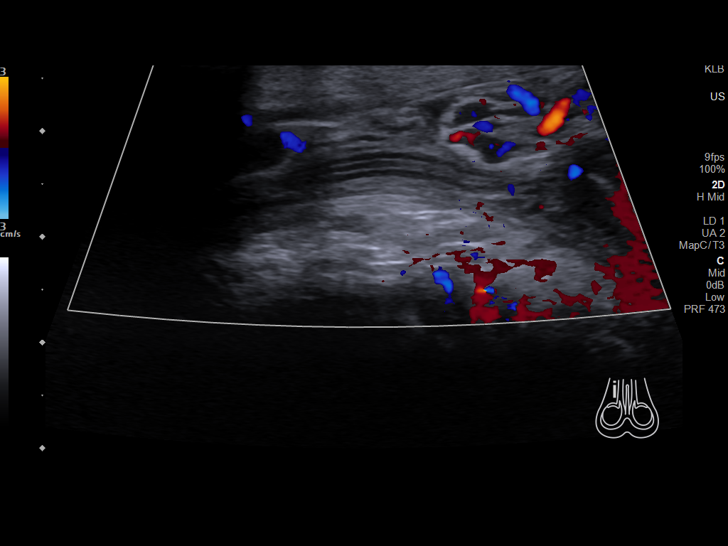

[13 of 25 positions shown; findings below may reference images not displayed]

FINDINGS: Right testicle

Measurements: 5.1 x 3.3 x 2.9 cm. No mass or microlithiasis
visualized.

Left testicle

Measurements: 4.4 x 2.8 x 2.7 cm. No mass or microlithiasis
visualized.

Right epididymis:  Normal in size and appearance.

Left epididymis:  Normal in size and appearance.

Hydrocele: There is a moderate hydrocele on the right. There is a
moderate hydrocele on the left with multiple septations within the
apparent hydrocele.

Varicocele:  None visualized.

Pulsed Doppler interrogation of both testes demonstrates normal low
resistance arterial and venous waveforms bilaterally.

No scrotal wall thickening or scrotal abscess on either side.
IMPRESSION: 1. Moderate hydrocele on each side. Hydrocele on the left shows
multiple septations, raising question of prior hemorrhage or
infection within the left scrotum. No abscess evident in this area
currently.

2. No epididymal enlargement on either side. No epididymal
inflammation or hyperemia.

3. Normal appearing testes bilaterally. No testicular mass or
orchitis. No testicular torsion evident on either side. Low
resistance waveform noted on each side.

## 2022-05-04 ENCOUNTER — Telehealth: Payer: Self-pay | Admitting: Physician Assistant

## 2022-05-04 DIAGNOSIS — J069 Acute upper respiratory infection, unspecified: Secondary | ICD-10-CM

## 2022-06-07 ENCOUNTER — Telehealth: Payer: Self-pay | Admitting: Emergency Medicine

## 2022-06-07 DIAGNOSIS — R051 Acute cough: Secondary | ICD-10-CM

## 2022-06-07 DIAGNOSIS — R300 Dysuria: Secondary | ICD-10-CM

## 2022-06-07 NOTE — Progress Notes (Signed)
Virtual Visit Consent   [facility], you are scheduled for a virtual visit with a [facility] provider today. Just as with appointments in the office, your consent must be obtained to participate. Your consent will be active for this visit and any virtual visit you may have with one of our providers in the next 365 days. If you have a MyChart account, a copy of this consent can be sent to you electronically.  As this is a virtual visit, video technology does not allow for your provider to perform a traditional examination. This may limit your provider's ability to fully assess your condition. If your provider identifies any concerns that need to be evaluated in person or the need to arrange testing (such as labs, EKG, etc.), we will make arrangements to do so. Although advances in technology are sophisticated, we cannot ensure that it will always work on either your end or our end. If the connection with a video visit is poor, the visit may have to be switched to a telephone visit. With either a video or telephone visit, we are not always able to ensure that we have a secure connection.  By engaging in this virtual visit, you consent to the provision of healthcare and authorize for your insurance to be billed (if applicable) for the services provided during this visit. Depending on your insurance coverage, you may receive a charge related to this service.  I need to obtain your verbal consent now. Are you willing to proceed with your visit today? Justin Vance has provided verbal consent on 06/07/2022 for a virtual visit (video or telephone). Roxy Horseman, PA-C  Date: 06/07/2022 10:03 AM  Virtual Visit via Video Note   I, Roxy Horseman, connected with  Justin Vance  ([id number], 1996-04-21) on 06/07/22 at 10:15 AM EDT by a video-enabled telemedicine application and verified that I am speaking with the correct person using two identifiers.  Location: Patient: Virtual Visit Location Patient:  Mobile Provider: Virtual Visit Location Provider: Home   I discussed the limitations of evaluation and management by telemedicine and the availability of in person appointments. The patient expressed understanding and agreed to proceed.    History of Present Illness: Justin Vance is a 26 y.o. who identifies as a male who was assigned male at birth, and is being seen today for coughing, SOB.  States that he works at [employer] where he works around different powders and substances.  States that he thinks his symptoms are aggravated by his work environment.  He also states that he would like to be treated for dysuria.  States he was diagnosed with syphilis or chlamydia, he can't remember which.  Chart review shows positive for chlamydia.  States that he is now having recurrent symptoms.  States that he has been dealing with this for "a while."  HPI: HPI  Problems: There are no problems to display for this patient.   Allergies: No Known Allergies Medications:  Current Outpatient Medications:    azithromycin (ZITHROMAX) 250 MG tablet, Take 4 tablets (1,000 mg total) by mouth daily. Take first 2 tablets together, then 1 every day until finished., Disp: 6 tablet, Rfl: 0   doxycycline (VIBRAMYCIN) 100 MG capsule, Take 1 capsule (100 mg total) by mouth 2 (two) times daily. One po bid x 7 days (Patient not taking: Reported on 09/18/2020), Disp: 40 capsule, Rfl: 0   HYDROcodone-acetaminophen (NORCO) 5-325 MG tablet, Take 1 tablet by mouth every 4 (four) hours as needed for moderate pain. (  Patient not taking: Reported on 09/18/2020), Disp: 20 tablet, Rfl: 0   HYDROcodone-acetaminophen (NORCO) 5-325 MG tablet, Take 1 tablet by mouth every 4 (four) hours as needed for moderate pain. (Patient not taking: Reported on 09/18/2020), Disp: 20 tablet, Rfl: 0   polyethylene glycol (MIRALAX / GLYCOLAX) 17 g packet, Take 17 g by mouth daily. (Patient not taking: Reported on 09/18/2020), Disp: 14 each, Rfl:  0  Observations/Objective: Patient is well-developed, well-nourished in no acute distress.  At work Head is normocephalic, atraumatic.  No labored breathing.  Speech is clear and coherent with logical content.  Patient is alert and oriented at baseline.    Assessment and Plan: 1. Acute cough  2. Dysuria  Refer to in-person clinic/UCC for evaluation of dysuria. Consider CXR for persistent cough.   Follow Up Instructions: I discussed the assessment and treatment plan with the patient. The patient was provided an opportunity to ask questions and all were answered. The patient agreed with the plan and demonstrated an understanding of the instructions.  A copy of instructions were sent to the patient via MyChart unless otherwise noted below.     The patient was advised to call back or seek an in-person evaluation if the symptoms worsen or if the condition fails to improve as anticipated.  Time:  I spent  minutes with the patient via telehealth technology discussing the above problems/concerns.    Roxy Horseman, PA-C

## 2022-06-07 NOTE — Patient Instructions (Signed)
I recommend that you be seen in person at one of our urgent care centers.   Because of your symptoms, I feel your condition warrants further evaluation and I recommend that you be seen in a face to face visit.     If you are having a true medical emergency please call 911.      For an urgent face to face visit, [place] has seven urgent care centers for your convenience:     [facility] Health Urgent Care Center at [facility] Directions [phone] [address] [place], [place] [address]    [facility] Health Urgent Care Center [facility]) Get Driving Directions [phone] [address] [place], [place] [address]  [facility] Health Urgent Care Center [facility]) Get Driving Directions [phone] [address] [place],  [place]  [address]  [facility] Health Urgent Care Center [facility] at [employer] Directions  [phone] [address] [place],  [place] [address]   [facility] Health Urgent Care at [facility] Get Driving Directions [phone] [address], [address] [place], [place] [address]   [facility] Health Urgent Care at [facility] Get Driving Directions  [phone] [address] [place] [address], [place] [address]   [facility] Health Urgent Care at [facility] Directions [phone] [address] [place], [place] [address]

## 2022-06-08 ENCOUNTER — Telehealth: Payer: Self-pay | Admitting: Physician Assistant

## 2022-06-08 DIAGNOSIS — T733XXA Exhaustion due to excessive exertion, initial encounter: Secondary | ICD-10-CM

## 2022-06-08 DIAGNOSIS — Z711 Person with feared health complaint in whom no diagnosis is made: Secondary | ICD-10-CM

## 2022-06-08 DIAGNOSIS — R051 Acute cough: Secondary | ICD-10-CM

## 2022-06-08 NOTE — Patient Instructions (Addendum)
  [address] thank you for joining Margaretann Loveless, PA-C for today's virtual visit.  While this provider is not your primary care provider (PCP), if your PCP is located in our provider database this encounter information will be shared with them immediately following your visit.  Consent: (Patient) Justin Vance provided verbal consent for this virtual visit at the beginning of the encounter.  Current Medications:  Current Outpatient Medications:    azithromycin (ZITHROMAX) 250 MG tablet, Take 4 tablets (1,000 mg total) by mouth daily. Take first 2 tablets together, then 1 every day until finished., Disp: 6 tablet, Rfl: 0   doxycycline (VIBRAMYCIN) 100 MG capsule, Take 1 capsule (100 mg total) by mouth 2 (two) times daily. One po bid x 7 days (Patient not taking: Reported on 09/18/2020), Disp: 40 capsule, Rfl: 0   HYDROcodone-acetaminophen (NORCO) 5-325 MG tablet, Take 1 tablet by mouth every 4 (four) hours as needed for moderate pain. (Patient not taking: Reported on 09/18/2020), Disp: 20 tablet, Rfl: 0   HYDROcodone-acetaminophen (NORCO) 5-325 MG tablet, Take 1 tablet by mouth every 4 (four) hours as needed for moderate pain. (Patient not taking: Reported on 09/18/2020), Disp: 20 tablet, Rfl: 0   polyethylene glycol (MIRALAX / GLYCOLAX) 17 g packet, Take 17 g by mouth daily. (Patient not taking: Reported on 09/18/2020), Disp: 14 each, Rfl: 0   Medications ordered in this encounter:  No orders of the defined types were placed in this encounter.    *If you need refills on other medications prior to your next appointment, please contact your pharmacy*  Follow-Up: Call back or seek an in-person evaluation if the symptoms worsen or if the condition fails to improve as anticipated.  Other Instructions  [facility] Department [phone] [address] [place], [place]  [address]   If you have been instructed to have an in-person evaluation today at a local Urgent Care  facility, please use the link below. It will take you to a list of all of our available [facility], including address, phone number and hours of operation. Please do not delay care.  [facility]  If you or a family member do not have a primary care provider, use the link below to schedule a visit and establish care. When you choose a [facility] primary care physician or advanced practice provider, you gain a long-term partner in health. Find a Primary Care Provider  Learn more about [facility]'s in-office and virtual care options:  - Get Care Now

## 2022-06-08 NOTE — Progress Notes (Signed)
Virtual Visit Consent   [facility], you are scheduled for a virtual visit with a [facility] provider today. Just as with appointments in the office, your consent must be obtained to participate. Your consent will be active for this visit and any virtual visit you may have with one of our providers in the next 365 days. If you have a MyChart account, a copy of this consent can be sent to you electronically.  As this is a virtual visit, video technology does not allow for your provider to perform a traditional examination. This may limit your provider's ability to fully assess your condition. If your provider identifies any concerns that need to be evaluated in person or the need to arrange testing (such as labs, EKG, etc.), we will make arrangements to do so. Although advances in technology are sophisticated, we cannot ensure that it will always work on either your end or our end. If the connection with a video visit is poor, the visit may have to be switched to a telephone visit. With either a video or telephone visit, we are not always able to ensure that we have a secure connection.  By engaging in this virtual visit, you consent to the provision of healthcare and authorize for your insurance to be billed (if applicable) for the services provided during this visit. Depending on your insurance coverage, you may receive a charge related to this service.  I need to obtain your verbal consent now. Are you willing to proceed with your visit today? Marcelles Bonano has provided verbal consent on 06/08/2022 for a virtual visit (video or telephone). Margaretann Loveless, PA-C  Date: 06/08/2022 8:33 AM  Virtual Visit via Video Note   I, Margaretann Loveless, connected with  Justin Vance  ([id number], May 20, 1996) on 06/08/22 at  8:15 AM EDT by a video-enabled telemedicine application and verified that I am speaking with the correct person using two identifiers.  Location: Patient: Virtual Visit Location Patient:  Home Provider: Virtual Visit Location Provider: Home Office   I discussed the limitations of evaluation and management by telemedicine and the availability of in person appointments. The patient expressed understanding and agreed to proceed.    History of Present Illness: Justin Vance is a 26 y.o. who identifies as a male who was assigned male at birth, and is being seen today for cough.  HPI: Cough This is a recurrent problem. The problem has been unchanged. The cough is Productive of sputum (reports he is cleaning out a black substance from his nose and cough).   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 He is the only Occupational hygienist.   Patient also feels that he has Chlamydia again. Having burning with urination.  Feeling fatigued and drained. Has been working in the heat the last few days.   He was seen yesterday, virtually, for same issues and was advised to be seen in person. Unable to afford in person evaluation.  Problems: There are no problems to display for this patient.   Allergies: No Known Allergies Medications:  Current Outpatient Medications:    azithromycin (ZITHROMAX) 250 MG tablet, Take 4 tablets (1,000 mg total) by mouth daily. Take first 2 tablets together, then 1 every day until finished., Disp: 6 tablet, Rfl: 0   doxycycline (VIBRAMYCIN) 100 MG capsule, Take 1 capsule (100 mg total) by mouth 2 (two) times daily. One po bid x  7 days (Patient not taking: Reported on 09/18/2020), Disp: 40 capsule, Rfl: 0   HYDROcodone-acetaminophen (NORCO) 5-325 MG tablet, Take 1 tablet by mouth every 4 (four) hours as needed for moderate pain. (Patient not taking: Reported on 09/18/2020), Disp: 20 tablet, Rfl: 0   HYDROcodone-acetaminophen (NORCO) 5-325 MG tablet, Take 1 tablet by mouth every 4 (four) hours as needed for moderate pain. (Patient not taking: Reported on 09/18/2020), Disp: 20 tablet,  Rfl: 0   polyethylene glycol (MIRALAX / GLYCOLAX) 17 g packet, Take 17 g by mouth daily. (Patient not taking: Reported on 09/18/2020), Disp: 14 each, Rfl: 0  Observations/Objective: Patient is well-developed, well-nourished in no acute distress.  Resting comfortably at home.  Head is normocephalic, atraumatic.  No labored breathing.  Speech is clear and coherent with logical content.  Patient is alert and oriented at baseline.    Assessment and Plan: 1. Acute cough  2. Concern about STD in male without diagnosis  3. Fatigue due to excessive exertion, initial encounter  - Cough secondary to workplace per patient. Has black discharge from respiratory passages - Advised to discuss with HR manager as they are required to make work place safe and should be able to get him a respirator to limit exposure  - Having symptoms consistent with previous Chalmydia infection from last year.  - Advised he needs to seek in person evaluation for testing; Discussed [facility] Department STI clinic (number provided in AVS)  - Fatigue seems most likely secondary to heat and work outdoors, possible dehydration - Discussed pushing fluids, including electrolyte beverages   Follow Up Instructions: I discussed the assessment and treatment plan with the patient. The patient was provided an opportunity to ask questions and all were answered. The patient agreed with the plan and demonstrated an understanding of the instructions.  A copy of instructions were sent to the patient via MyChart unless otherwise noted below.    The patient was advised to call back or seek an in-person evaluation if the symptoms worsen or if the condition fails to improve as anticipated.  Time:  I spent 15 minutes with the patient via telehealth technology discussing the above problems/concerns.    Margaretann Loveless, PA-C

## 2022-06-22 ENCOUNTER — Telehealth: Payer: Self-pay | Admitting: Physician Assistant

## 2022-06-22 ENCOUNTER — Encounter: Payer: Self-pay | Admitting: Physician Assistant

## 2022-06-22 DIAGNOSIS — J069 Acute upper respiratory infection, unspecified: Secondary | ICD-10-CM

## 2022-06-22 DIAGNOSIS — B9689 Other specified bacterial agents as the cause of diseases classified elsewhere: Secondary | ICD-10-CM

## 2022-06-22 MED ORDER — BENZONATATE 100 MG PO CAPS: 100.0000 mg | ORAL_CAPSULE | Freq: Three times a day (TID) | ORAL | 0 refills | Status: AC | PRN

## 2022-06-22 MED ORDER — AZITHROMYCIN 250 MG PO TABS: ORAL_TABLET | ORAL | 0 refills | Status: AC

## 2022-06-22 MED ORDER — PREDNISONE 20 MG PO TABS: 40.0000 mg | ORAL_TABLET | Freq: Every day | ORAL | 0 refills | Status: AC

## 2022-06-22 NOTE — Progress Notes (Signed)
Duplicate. Patient had EV earlier. Just needed a work note

## 2022-06-22 NOTE — Progress Notes (Signed)
We are sorry that you are not feeling well.  Here is how we plan to help!  Based on your presentation I believe you most likely have A cough due to bacteria.  When patients have a fever and a productive cough with a change in color or increased sputum production, we are concerned about bacterial bronchitis.  If left untreated it can progress to pneumonia.  If your symptoms do not improve with your treatment plan it is important that you contact your provider.   I have prescribed Azithromyin 250 mg: two tablets now and then one tablet daily for 4 additonal days    In addition you may use A non-prescription cough medication called Mucinex DM: take 2 tablets every 12 hours. and A prescription cough medication called Tessalon Perles 100mg . You may take 1-2 capsules every 8 hours as needed for your cough.  Prednisone 20mg  Take 2 tablets (40mg ) daily for 5 days  From your responses in the eVisit questionnaire you describe inflammation in the upper respiratory tract which is causing a significant cough.  This is commonly called Bronchitis and has four common causes:   Allergies Viral Infections Acid Reflux Bacterial Infection Allergies, viruses and acid reflux are treated by controlling symptoms or eliminating the cause. An example might be a cough caused by taking certain blood pressure medications. You stop the cough by changing the medication. Another example might be a cough caused by acid reflux. Controlling the reflux helps control the cough.  USE OF BRONCHODILATOR ("RESCUE") INHALERS: There is a risk from using your bronchodilator too frequently.  The risk is that over-reliance on a medication which only relaxes the muscles surrounding the breathing tubes can reduce the effectiveness of medications prescribed to reduce swelling and congestion of the tubes themselves.  Although you feel brief relief from the bronchodilator inhaler, your asthma may actually be worsening with the tubes becoming more  swollen and filled with mucus.  This can delay other crucial treatments, such as oral steroid medications. If you need to use a bronchodilator inhaler daily, several times per day, you should discuss this with your provider.  There are probably better treatments that could be used to keep your asthma under control.     HOME CARE Only take medications as instructed by your medical team. Complete the entire course of an antibiotic. Drink plenty of fluids and get plenty of rest. Avoid close contacts especially the very young and the elderly Cover your mouth if you cough or cough into your sleeve. Always remember to wash your hands A steam or ultrasonic humidifier can help congestion.   GET HELP RIGHT AWAY IF: You develop worsening fever. You become short of breath You cough up blood. Your symptoms persist after you have completed your treatment plan MAKE SURE YOU  Understand these instructions. Will watch your condition. Will get help right away if you are not doing well or get worse.    Thank you for choosing an e-visit.  Your e-visit answers were reviewed by a board certified advanced clinical practitioner to complete your personal care plan. Depending upon the condition, your plan could have included both over the counter or prescription medications.  Please review your pharmacy choice. Make sure the pharmacy is open so you can pick up prescription now. If there is a problem, you may contact your provider through and have the prescription routed to another pharmacy.  Your safety is important to . If you have drug allergies check your prescription carefully.  For the next 24 hours you can use MyChart to ask questions about today's visit, request a non-urgent call back, or ask for a work or school excuse. You will get an email in the next two days asking about your experience. I hope that your e-visit has been valuable and will speed your recovery.  I provided 5  minutes of non face-to-face time during this encounter for chart review and documentation.   

## 2022-06-26 ENCOUNTER — Telehealth: Payer: Self-pay | Admitting: Physician Assistant

## 2022-06-26 DIAGNOSIS — R059 Cough, unspecified: Secondary | ICD-10-CM

## 2022-06-26 NOTE — Progress Notes (Signed)
We are sorry that you are not feeling well.  Here is how we plan to help!  On review of your prior records it appears you were diagnosed with a bacteria upper respiratory infection. You noted in your questionnaire that you did not pick up the prescriptions. I would recommend that you pick them up and take them to help with your continued symptoms.  From your responses in the eVisit questionnaire you describe inflammation in the upper respiratory tract which is causing a significant cough.  This is commonly called Bronchitis and has four common causes:   Allergies Viral Infections Acid Reflux Bacterial Infection Allergies, viruses and acid reflux are treated by controlling symptoms or eliminating the cause. An example might be a cough caused by taking certain blood pressure medications. You stop the cough by changing the medication. Another example might be a cough caused by acid reflux. Controlling the reflux helps control the cough.  USE OF BRONCHODILATOR ("RESCUE") INHALERS: There is a risk from using your bronchodilator too frequently.  The risk is that over-reliance on a medication which only relaxes the muscles surrounding the breathing tubes can reduce the effectiveness of medications prescribed to reduce swelling and congestion of the tubes themselves.  Although you feel brief relief from the bronchodilator inhaler, your asthma may actually be worsening with the tubes becoming more swollen and filled with mucus.  This can delay other crucial treatments, such as oral steroid medications. If you need to use a bronchodilator inhaler daily, several times per day, you should discuss this with your provider.  There are probably better treatments that could be used to keep your asthma under control.     HOME CARE Only take medications as instructed by your medical team. Complete the entire course of an antibiotic. Drink plenty of fluids and get plenty of rest. Avoid close contacts especially the  very young and the elderly Cover your mouth if you cough or cough into your sleeve. Always remember to wash your hands A steam or ultrasonic humidifier can help congestion.   GET HELP RIGHT AWAY IF: You develop worsening fever. You become short of breath You cough up blood. Your symptoms persist after you have completed your treatment plan MAKE SURE YOU  Understand these instructions. Will watch your condition. Will get help right away if you are not doing well or get worse.    Thank you for choosing an e-visit.  Your e-visit answers were reviewed by a board certified advanced clinical practitioner to complete your personal care plan. Depending upon the condition, your plan could have included both over the counter or prescription medications.  Please review your pharmacy choice. Make sure the pharmacy is open so you can pick up prescription now. If there is a problem, you may contact your provider through [employer] and have the prescription routed to another pharmacy.  Your safety is important to [place]. If you have drug allergies check your prescription carefully.   For the next 24 hours you can use MyChart to ask questions about today's visit, request a non-urgent call back, or ask for a work or school excuse. You will get an email in the next two days asking about your experience. I hope that your e-visit has been valuable and will speed your recovery.  Approximately 5 minutes was spent documenting and reviewing patient's chart.

## 2022-10-16 ENCOUNTER — Telehealth: Payer: Self-pay | Admitting: Physician Assistant

## 2022-10-16 DIAGNOSIS — Z202 Contact with and (suspected) exposure to infections with a predominantly sexual mode of transmission: Secondary | ICD-10-CM

## 2022-10-16 NOTE — Patient Instructions (Signed)
  Theodor[facility] Welte, thank you for joining [name], PA-C for today's virtual visit.  While this provider is not your primary care provider (PCP), if your PCP is located in our provider database this encounter information will be shared with them immediately following your visit.   A [facility] MyChart account gives you access to today's visit and all your visits, tests, and labs performed at [facility] " click here if you don't have a [facility] MyChart account or go to [email or web address]  Consent: (Patient) Justin Vance provided verbal consent for this virtual visit at the beginning of the encounter.  Current Medications:  Current Outpatient Medications:    benzonatate (TESSALON) 100 MG capsule, Take 1 capsule (100 mg total) by mouth 3 (three) times daily as needed., Disp: 30 capsule, Rfl: 0   predniSONE (DELTASONE) 20 MG tablet, Take 2 tablets (40 mg total) by mouth daily with breakfast., Disp: 10 tablet, Rfl: 0   Medications ordered in this encounter:  No orders of the defined types were placed in this encounter.    *If you need refills on other medications prior to your next appointment, please contact your pharmacy*  Follow-Up: Call back or seek an in-person evaluation if the symptoms worsen or if the condition fails to improve as anticipated.  [facility] [phone]  Other Instructions Since you are not in our area anymore, you may want to consider calling your local health department, seeing a local urgent care, or you can connect with an [facility] provider by going through [email or web address]   If you have been instructed to have an in-person evaluation today at a local Urgent Care facility, please use the link below. It will take you to a list of all of our available [facility], including address, phone number and hours of operation. Please do not delay care.  [facility]  If you or a family member do not  have a primary care provider, use the link below to schedule a visit and establish care. When you choose a [facility] primary care physician or advanced practice provider, you gain a long-term partner in health. Find a Primary Care Provider  Learn more about [facility]'s in-office and virtual care options:  - Get Care Now

## 2022-10-16 NOTE — Progress Notes (Signed)
Virtual Visit Consent   [name], you are scheduled for a virtual visit with a [facility] provider today. Just as with appointments in the office, your consent must be obtained to participate. Your consent will be active for this visit and any virtual visit you may have with one of our providers in the next 365 days. If you have a MyChart account, a copy of this consent can be sent to you electronically.  As this is a virtual visit, video technology does not allow for your provider to perform a traditional examination. This may limit your provider's ability to fully assess your condition. If your provider identifies any concerns that need to be evaluated in person or the need to arrange testing (such as labs, EKG, etc.), we will make arrangements to do so. Although advances in technology are sophisticated, we cannot ensure that it will always work on either your end or our end. If the connection with a video visit is poor, the visit may have to be switched to a telephone visit. With either a video or telephone visit, we are not always able to ensure that we have a secure connection.  By engaging in this virtual visit, you consent to the provision of healthcare and authorize for your insurance to be billed (if applicable) for the services provided during this visit. Depending on your insurance coverage, you may receive a charge related to this service.  I need to obtain your verbal consent now. Are you willing to proceed with your visit today? Justin Vance has provided verbal consent on 10/16/2022 for a virtual visit (video or telephone). Justin Vance, [place]  Date: 10/16/2022 8:21 AM  Virtual Visit via Video Note   I, Justin Vance, connected with  Justin Vance  ([id number], September 11, 1996) on 10/16/22 at  8:15 AM EST by a video-enabled telemedicine application and verified that I am speaking with the correct person using two identifiers.  Location: Patient: Virtual Visit Location Patient:  Home Provider: Virtual Visit Location Provider: Home Office   I discussed the limitations of evaluation and management by telemedicine and the availability of in person appointments. The patient expressed understanding and agreed to proceed.    History of Present Illness: Justin Vance is a 26 y.o. who identifies as a male who was assigned male at birth, and is being seen today for possible incomplete treatment for chlamydia. Was diagnosed in 2021 at [facility] and given Im Rocephin and course of oral antibiotics which he did not complete. Has been asymptomatic until the past few weeks or so, with new partner starting to have pelvic symptoms so was seen in person and tested positive for chlamydia. Is unsure if they tested positive for anything else. Is wanting treatment via video as he moved and is not near a [facility] facility.   HPI: HPI  Problems: There are no problems to display for this patient.   Allergies: No Known Allergies Medications:  Current Outpatient Medications:    benzonatate (TESSALON) 100 MG capsule, Take 1 capsule (100 mg total) by mouth 3 (three) times daily as needed., Disp: 30 capsule, Rfl: 0   predniSONE (DELTASONE) 20 MG tablet, Take 2 tablets (40 mg total) by mouth daily with breakfast., Disp: 10 tablet, Rfl: 0  Observations/Objective: Patient is well-developed, well-nourished in no acute distress.  Resting comfortably at home.  Head is normocephalic, atraumatic.  No labored breathing. Speech is clear and coherent with logical content.  Patient is alert and oriented at baseline.   Assessment  and Plan: 1. Possible exposure to STD  New exposure versus prior incomplete treatment. Is aware we cannot diagnose or treat STI via VUCV and needs in-person evaluation. Since out of our area he has been instructed to go to nearest UC or contact local health department. Can try [facility] video but discussed cannot promise they would treat via video either. He is to refrain from activity until  evaluated and treated properly.  Follow Up Instructions: I discussed the assessment and treatment plan with the patient. The patient was provided an opportunity to ask questions and all were answered. The patient agreed with the plan and demonstrated an understanding of the instructions.  A copy of instructions were sent to the patient via MyChart unless otherwise noted below.   The patient was advised to call back or seek an in-person evaluation if the symptoms worsen or if the condition fails to improve as anticipated.  Time:  I spent  minutes with the patient via telehealth technology discussing the above problems/concerns.    Justin Climes, PA-C

## 2023-05-06 ENCOUNTER — Telehealth: Payer: Self-pay | Admitting: Physician Assistant

## 2023-05-06 ENCOUNTER — Other Ambulatory Visit: Payer: Self-pay

## 2023-05-06 ENCOUNTER — Encounter (HOSPITAL_BASED_OUTPATIENT_CLINIC_OR_DEPARTMENT_OTHER): Payer: Self-pay

## 2023-05-06 ENCOUNTER — Emergency Department (HOSPITAL_BASED_OUTPATIENT_CLINIC_OR_DEPARTMENT_OTHER): Payer: Self-pay

## 2023-05-06 DIAGNOSIS — S99921A Unspecified injury of right foot, initial encounter: Secondary | ICD-10-CM

## 2023-05-06 DIAGNOSIS — S9031XA Contusion of right foot, initial encounter: Secondary | ICD-10-CM | POA: Insufficient documentation

## 2023-05-06 DIAGNOSIS — Y92838 Other recreation area as the place of occurrence of the external cause: Secondary | ICD-10-CM | POA: Insufficient documentation

## 2023-05-06 DIAGNOSIS — X58XXXA Exposure to other specified factors, initial encounter: Secondary | ICD-10-CM | POA: Insufficient documentation

## 2023-05-06 NOTE — ED Notes (Signed)
Patient refused XRAY at this time.

## 2023-05-06 NOTE — Progress Notes (Signed)
Virtual Visit Consent   [facility], you are scheduled for a virtual visit with a  provider today. Just as with appointments in the office, your consent must be obtained to participate. Your consent will be active for this visit and any virtual visit you may have with one of our providers in the next 365 days. If you have a MyChart account, a copy of this consent can be sent to you electronically.  As this is a virtual visit, video technology does not allow for your provider to perform a traditional examination. This may limit your provider's ability to fully assess your condition. If your provider identifies any concerns that need to be evaluated in person or the need to arrange testing (such as labs, EKG, etc.), we will make arrangements to do so. Although advances in technology are sophisticated, we cannot ensure that it will always work on either your end or our end. If the connection with a video visit is poor, the visit may have to be switched to a telephone visit. With either a video or telephone visit, we are not always able to ensure that we have a secure connection.  By engaging in this virtual visit, you consent to the provision of healthcare and authorize for your insurance to be billed (if applicable) for the services provided during this visit. Depending on your insurance coverage, you may receive a charge related to this service.  I need to obtain your verbal consent now. Are you willing to proceed with your visit today? Justin Vance has provided verbal consent on 05/06/2023 for a virtual visit (video or telephone). Margaretann Loveless, PA-C  Date: 05/06/2023 8:15 AM  Virtual Visit via Video Note   I, Margaretann Loveless, connected with  Justin Vance  ([id number], [birth year]) on 05/06/23 at  8:00 AM EDT by a video-enabled telemedicine application and verified that I am speaking with the correct person using two identifiers.  Location: Patient: Virtual Visit Location Patient:  Home Provider: Virtual Visit Location Provider: Home Office   I discussed the limitations of evaluation and management by telemedicine and the availability of in person appointments. The patient expressed understanding and agreed to proceed.    History of Present Illness: Justin Vance is a 27 y.o. who identifies as a male who was assigned male at birth, and is being seen today for foot injury.  HPI: Foot Injury  The incident occurred 3 to 5 days ago (Friday). The incident occurred at work. The injury mechanism was a compression (almost 3,000 pound box dropped on foot). The pain is present in the right foot. The quality of the pain is described as aching and stabbing. The pain is moderate. The pain has been Constant since onset. Associated symptoms include a loss of motion (inability to push off toes) and numbness (right after injury). Pertinent negatives include no inability to bear weight, loss of sensation, muscle weakness or tingling. Associated symptoms comments: Was swollen just in area that was hit, small abrasion of skin that is healing. He reports no foreign bodies present. He has tried elevation and ice for the symptoms. The treatment provided mild relief.     Problems: There are no problems to display for this patient.   Allergies: No Known Allergies Medications:  Current Outpatient Medications:    benzonatate (TESSALON) 100 MG capsule, Take 1 capsule (100 mg total) by mouth 3 (three) times daily as needed., Disp: 30 capsule, Rfl: 0   predniSONE (DELTASONE) 20 MG tablet, Take 2 tablets (  40 mg total) by mouth daily with breakfast., Disp: 10 tablet, Rfl: 0  Observations/Objective: Patient is well-developed, well-nourished in no acute distress.  Resting comfortably at home.  Head is normocephalic, atraumatic.  No labored breathing.  Speech is clear and coherent with logical content.  Patient is alert and oriented at baseline.    Assessment and Plan: 1. Injury of right foot, initial  encounter  - Discussed conservative measures with rest, ice, compression, elevation - Discussed voltaren gel (diclofenac) - Offered Meloxicam, patient declined - Patient requesting long work note. Advised we could only give day seen and following day. He requested longer without another visit. Advised we could not, he would require in person evaluation. Discussed Urgent Care versus Orthopedic Urgent Care. He stated he "just wanted as long of a note as we could give him without having to have another visit." Discussed departmental work note policy. Patient became aggravated and said "there is always something with this, y'all need to just take this service away." Apologized and advised he could call for phone number in AVS for complaints. He got frustrated and ended call.  Follow Up Instructions: I discussed the assessment and treatment plan with the patient. The patient was provided an opportunity to ask questions and all were answered. The patient agreed with the plan and demonstrated an understanding of the instructions.  A copy of instructions were sent to the patient via MyChart unless otherwise noted below.    The patient was advised to call back or seek an in-person evaluation if the symptoms worsen or if the condition fails to improve as anticipated.  Time:  I spent 15 minutes with the patient via telehealth technology discussing the above problems/concerns.    Margaretann Loveless, PA-C

## 2023-05-06 NOTE — Patient Instructions (Signed)
  [address], thank you for joining Margaretann Loveless, PA-C for today's virtual visit.  While this provider is not your primary care provider (PCP), if your PCP is located in our provider database this encounter information will be shared with them immediately following your visit.   A [facility] MyChart account gives you access to today's visit and all your visits, tests, and labs performed at [facility] " click here if you don't have a [facility] MyChart account or go to [email or web address]  Consent: (Patient) Justin Vance provided verbal consent for this virtual visit at the beginning of the encounter.  Current Medications:  Current Outpatient Medications:    benzonatate (TESSALON) 100 MG capsule, Take 1 capsule (100 mg total) by mouth 3 (three) times daily as needed., Disp: 30 capsule, Rfl: 0   predniSONE (DELTASONE) 20 MG tablet, Take 2 tablets (40 mg total) by mouth daily with breakfast., Disp: 10 tablet, Rfl: 0   Medications ordered in this encounter:  No orders of the defined types were placed in this encounter.    *If you need refills on other medications prior to your next appointment, please contact your pharmacy*  Follow-Up: Call back or seek an in-person evaluation if the symptoms worsen or if the condition fails to improve as anticipated.  [facility] [phone]  Other Instructions [facility] Location: [facility] [address] [place], [place] [address] Phone: [phone] Convenient hours: Monday - Friday: 11 a.m. - 7 p.m. Visit our website to schedule an appointment online or walk-in during clinic hours.  [facility] [address], [place], [place] [address] URGENT CARE HOURS Monday - Friday: 8:00am to 8:00pm Saturday: 10:00am to 3:00pm [phone]    If you have been instructed to have an in-person evaluation today at a local Urgent  Care facility, please use the link below. It will take you to a list of all of our available [facility], including address, phone number and hours of operation. Please do not delay care.  [facility]  If you or a family member do not have a primary care provider, use the link below to schedule a visit and establish care. When you choose a [facility] primary care physician or advanced practice provider, you gain a long-term partner in health. Find a Primary Care Provider  Learn more about [facility]'s in-office and virtual care options: [facility] - Get Care Now

## 2023-05-06 NOTE — ED Triage Notes (Signed)
Patient here POV from Home.  Endorses dropping Paper Roll on Right Foot Friday. Also notes getting stepped on yesterday.   NAD noted during Triage. A&Ox4. GCS 15. Ambulatory.

## 2023-05-07 ENCOUNTER — Emergency Department (HOSPITAL_BASED_OUTPATIENT_CLINIC_OR_DEPARTMENT_OTHER): Payer: Self-pay | Admitting: Radiology

## 2023-05-07 ENCOUNTER — Emergency Department (HOSPITAL_BASED_OUTPATIENT_CLINIC_OR_DEPARTMENT_OTHER)
Admission: EM | Admit: 2023-05-07 | Discharge: 2023-05-07 | Disposition: A | Discharge status: Home / Self Care | Payer: Self-pay | Attending: Emergency Medicine | Admitting: Emergency Medicine

## 2023-05-07 DIAGNOSIS — S9031XA Contusion of right foot, initial encounter: Secondary | ICD-10-CM

## 2023-05-07 NOTE — ED Provider Notes (Signed)
   [place] EMERGENCY DEPARTMENT AT [facility]  Provider Note  CSN: [id number] Arrival date & time: 05/06/23 2219  History Chief Complaint  Patient presents with   Foot Injury    Justin Vance is a 27 y.o. male reports he was at work several days ago when a 200-300lb roll of paper fell onto his R foot. Also reports he injured it at a trampoline park yesterday. Now with pain to dorsal foot.    Home Medications Prior to Admission medications   Medication Sig Start Date End Date Taking? Authorizing Provider  benzonatate (TESSALON) 100 MG capsule Take 1 capsule (100 mg total) by mouth 3 (three) times daily as needed. 06/22/22   Margaretann Loveless, PA-C  predniSONE (DELTASONE) 20 MG tablet Take 2 tablets (40 mg total) by mouth daily with breakfast. 06/22/22   Margaretann Loveless, PA-C     Allergies    Patient has no known allergies.   Review of Systems   Review of Systems Please see HPI for pertinent positives and negatives  Physical Exam BP 105/77 (BP Location: Right Arm)   Pulse (!) 50   Temp 97.8 F (36.6 C)   Resp 17   Ht 5\' 10"  (1.778 m)   Wt 64.4 kg   SpO2 100%   BMI 20.37 kg/m   Physical Exam Vitals and nursing note reviewed.  HENT:     Head: Normocephalic.     Nose: Nose normal.  Eyes:     Extraocular Movements: Extraocular movements intact.  Pulmonary:     Effort: Pulmonary effort is normal.  Musculoskeletal:        General: Swelling and tenderness (dorsal R foot) present. Normal range of motion.     Cervical back: Neck supple.  Skin:    Findings: No rash (on exposed skin).  Neurological:     Mental Status: He is alert and oriented to person, place, and time.  Psychiatric:        Mood and Affect: Mood normal.     ED Results / Procedures / Treatments   EKG None  Procedures Procedures  Medications Ordered in the ED Medications - No data to display  Initial Impression and Plan  Patient here with foot injury, initially declined xrays  for unknown reason, but will agree to imaging now.   ED Course   Clinical Course as of 05/07/23 0606  Tue May 07, 2023  0604 I personally viewed the images from radiology studies and agree with radiologist interpretation: Xray is negative. Patient states he needs a work note for longer than two days. I advised that this is as long as we can give him from the ED. He states he was told on a televisit to come here to get a longer work note (notes from [employer] [facility] and/or Ortho as placed to be seen).  [CS]    Clinical Course User Index [CS] Pollyann Savoy, MD     MDM Rules/Calculators/A&P Medical Decision Making Problems Addressed: Contusion of right foot, initial encounter: acute illness or injury  Amount and/or Complexity of Data Reviewed Radiology: ordered and independent interpretation performed. Decision-making details documented in ED Course.     Final Clinical Impression(s) / ED Diagnoses Final diagnoses:  Contusion of right foot, initial encounter    Rx / DC Orders ED Discharge Orders     None        Pollyann Savoy, MD 05/07/23 [phone]

## 2024-09-12 ENCOUNTER — Emergency Department (HOSPITAL_BASED_OUTPATIENT_CLINIC_OR_DEPARTMENT_OTHER): Payer: Self-pay | Admitting: Radiology

## 2024-09-12 ENCOUNTER — Other Ambulatory Visit: Payer: Self-pay

## 2024-09-12 ENCOUNTER — Encounter (HOSPITAL_BASED_OUTPATIENT_CLINIC_OR_DEPARTMENT_OTHER): Payer: Self-pay

## 2024-09-12 ENCOUNTER — Emergency Department (HOSPITAL_BASED_OUTPATIENT_CLINIC_OR_DEPARTMENT_OTHER)
Admission: EM | Admit: 2024-09-12 | Discharge: 2024-09-12 | Disposition: A | Discharge status: Home / Self Care | Payer: Self-pay | Attending: Emergency Medicine | Admitting: Emergency Medicine

## 2024-09-12 DIAGNOSIS — R051 Acute cough: Secondary | ICD-10-CM | POA: Insufficient documentation

## 2024-09-12 DIAGNOSIS — R0789 Other chest pain: Secondary | ICD-10-CM | POA: Insufficient documentation

## 2024-09-12 LAB — RESP PANEL BY RT-PCR (RSV, FLU A&B, COVID)  RVPGX2
Influenza A by PCR: NEGATIVE
Influenza B by PCR: NEGATIVE
Resp Syncytial Virus by PCR: NEGATIVE
SARS Coronavirus 2 by RT PCR: NEGATIVE

## 2024-09-12 MED ORDER — LIDOCAINE 5 % EX PTCH
1.0000 | MEDICATED_PATCH | Freq: Once | CUTANEOUS | Status: DC
  Administered 2024-09-12: 1 via TRANSDERMAL
  Filled 2024-09-12: qty 1

## 2024-09-12 MED ORDER — LIDOCAINE 5 % EX PTCH: 1.0000 | MEDICATED_PATCH | CUTANEOUS | 0 refills | Status: AC

## 2024-09-12 MED ORDER — OXYCODONE HCL 5 MG PO TABS
5.0000 mg | ORAL_TABLET | Freq: Once | ORAL | Status: AC
  Administered 2024-09-12: 5 mg via ORAL
  Filled 2024-09-12: qty 1

## 2024-09-12 MED ORDER — IBUPROFEN 400 MG PO TABS
600.0000 mg | ORAL_TABLET | Freq: Once | ORAL | Status: AC
  Administered 2024-09-12: 600 mg via ORAL
  Filled 2024-09-12: qty 1

## 2024-09-12 NOTE — Discharge Instructions (Addendum)
 You appear to have an upper respiratory infection (URI). An upper respiratory tract infection, or cold, is a viral infection of the air passages leading to the lungs. It should improve gradually after 5-7 days. You may have a lingering cough that lasts for 2- 4 weeks after the infection.  Your illness is contagious and can be spread to others. It cannot be cured by antibiotics or other medicines. Take basic precautions such as washing your hands often, covering your mouth when you cough or sneeze, and avoiding public places where you could spread your illness to others.   Your flu, covid, and RSV test were negative today.  Your chest x-ray did not show any signs of pneumonia. No signs of rib fracture or other abnormality on your x-ray  Home care instructions:  You may take up to 1000mg  of tylenol  every 6 hours as needed for pain.  Do not take more then 4g per day.  You may use up to 600mg  ibuprofen every 6 hours as needed for pain.  Do not exceed 2.4g of ibuprofen per day.  You were given your first dose here today.  You may use the lidocaine  patch as needed for pain.  You may apply 1 patch to up to 12 hours at a time.  You must remove the patch for full 12 hours before reapplying a new patch.   For cough: honey 1/2 to 1 teaspoon (you can dilute the honey in water or another fluid).  You can also use guaifenesin and dextromethorphan for cough which are over-the-counter medications. You can use a humidifier for chest congestion and cough.  If you don't have a humidifier, you can sit in the bathroom with the hot shower running.       For congestion: Flonase (Fluticasone) 1-2 sprays in each nostril daily. This is an over the counter medication.    It is important to stay hydrated: drink plenty of fluids (water, gatorade/powerade/pedialyte, juices, or teas) to help loosen the mucous in your lungs  Follow-up instructions: Please follow-up with your primary care provider for further evaluation of  your symptoms if you are not feeling better within the next 5 days.   Return instructions:  Please return to the Emergency Department if you experience worsening symptoms.  RETURN IMMEDIATELY IF you develop shortness of breath, confusion or altered mental status, a new rash, become dizzy, faint, or poorly responsive, or are unable to be cared for at home. Please return if you have persistent vomiting and cannot keep down fluids or develop a fever that is not controlled by tylenol  or motrin.   Please return if you have any other emergent concerns.

## 2024-09-12 NOTE — ED Triage Notes (Addendum)
 Cough with yellow sputum, onset last week. Pain to left ribs onset yesterday at 1630 from horse playing yesterday. Someone might have landed on his chest. Denies fever, rhinorrhea, ear pain, or headache.

## 2024-09-12 NOTE — ED Provider Notes (Signed)
 [place] EMERGENCY DEPARTMENT AT [facility] Provider Note   CSN: [id number] Arrival date & time: 09/12/24  1258     Patient presents with: Cough and Rib Injury   Justin Vance is a 28 y.o. male with no significant past medical history presents with concern for a ongoing cough for the past week.  Reports he is coughing up some yellow appearing sputum.  Denies any associated fever, chills, sore throat, or shortness of breath.  He reports that last night, he was horse playing and someone hit the left side of his chest [place].  He was reporting pain to the upper left ribs after this occurred.    Cough      Prior to Admission medications   Medication Sig Start Date End Date Taking? Authorizing Provider  lidocaine  (LIDODERM ) 5 % Place 1 patch onto the skin daily. Remove & Discard patch within 12 hours or as directed by MD 09/12/24  Yes Veta Palma, PA-C  benzonatate  (TESSALON ) 100 MG capsule Take 1 capsule (100 mg total) by mouth 3 (three) times daily as needed. 06/22/22   Vivienne Delon HERO, PA-C  predniSONE  (DELTASONE ) 20 MG tablet Take 2 tablets (40 mg total) by mouth daily with breakfast. 06/22/22   Vivienne Delon HERO, PA-C    Allergies: Patient has no known allergies.    Review of Systems  Respiratory:  Positive for cough.     Updated Vital Signs BP (!) 132/91 (BP Location: Left Arm)   Pulse 79   Temp 97.8 F (36.6 C) (Oral)   Resp 16   Ht 5' 10 (1.778 m)   Wt 63.5 kg   SpO2 98%   BMI 20.09 kg/m   Physical Exam Vitals and nursing note reviewed.  Constitutional:      General: He is not in acute distress.    Appearance: He is well-developed.  HENT:     Head: Normocephalic and atraumatic.  Eyes:     Conjunctiva/sclera: Conjunctivae normal.  Cardiovascular:     Rate and Rhythm: Normal rate and regular rhythm.     Heart sounds: No murmur heard.    Comments: 2+ radial pulses bilaterally Pulmonary:     Effort: Pulmonary effort is normal. No respiratory  distress.     Breath sounds: Normal breath sounds.  Abdominal:     Palpations: Abdomen is soft.     Tenderness: There is no abdominal tenderness.  Musculoskeletal:        General: No swelling.     Cervical back: Neck supple.     Comments: Tender to palpation over the left upper sternum and upper left ribs.  Reports pain in the left upper rib cage with range of motion of the left shoulder.  Skin:    General: Skin is warm and dry.     Capillary Refill: Capillary refill takes less than 2 seconds.  Neurological:     Mental Status: He is alert.  Psychiatric:        Mood and Affect: Mood normal.     (all labs ordered are listed, but only abnormal results are displayed) Labs Reviewed  RESP PANEL BY RT-PCR (RSV, FLU A&B, COVID)  [id number]    EKG: None  Radiology: DG Ribs Unilateral W/Chest Left Result Date: 09/12/2024 CLINICAL DATA:  Left chest injury and rib pain. EXAM: LEFT RIBS AND CHEST - 3+ VIEW COMPARISON:  None Available. FINDINGS: No fracture or other bone lesions are seen involving the ribs. There is no evidence of pneumothorax or pleural effusion.  Both lungs are clear. Heart size and mediastinal contours are within normal limits. IMPRESSION: Negative. Electronically Signed   By: Norleen DELENA Kil M.D.   On: 09/12/2024 13:55     Procedures   Medications Ordered in the ED  lidocaine  (LIDODERM ) 5 % 1 patch (1 patch Transdermal Patch Applied 09/12/24 1419)  oxyCODONE (Oxy IR/ROXICODONE) immediate release tablet 5 mg (5 mg Oral Given 09/12/24 1423)  ibuprofen (ADVIL) tablet 600 mg (600 mg Oral Given 09/12/24 1423)                                    Medical Decision Making Amount and/or Complexity of Data Reviewed Radiology: ordered.  Risk Prescription drug management.     Differential diagnosis includes but is not limited to COVID, flu, RSV, viral URI, strep pharyngitis, viral pharyngitis, allergic rhinitis, pneumonia, bronchitis   ED Course:  Upon initial evaluation,  patient is well-appearing, no acute distress.  Lungs clear to auscultation bilaterally.  Reporting pain to the left upper ribs, mostly by the sternum.  No obvious deformity.  No overlying bruising.  Labs Ordered: I Ordered, and personally interpreted labs.  The pertinent results include:   COVID, flu, RSV negative  Imaging Studies ordered: I ordered imaging studies including chest x-ray I independently visualized the imaging with scope of interpretation limited to determining acute life threatening conditions related to emergency care. Imaging showed no acute abnormality I agree with the radiologist interpretation   Medications Given: Oxycodone Ibuprofen Lidocaine  patch  Upon re-evaluation, patient reports pain not significantly better with the pain medications given.  His x-ray is reassuring.  No signs of pneumonia.  His flu, COVID, and RSV testing is negative.  Suspect other viral URI as the cause of his cough.  Regarding his rib pain, no evidence of rib fracture or dislocation.  No pneumothorax.  Question if he may have an underlying bone contusion where he was hit.  We will continue with pain management with Tylenol  and ibuprofen at home and lidocaine  patches.  Patient stable and appropriate for discharge home.    Impression: Viral URI   Disposition:  Discharged home with instructions to use over-the-counter medications as needed for symptom control.  Lidocaine  patches as needed. Follow-up with PCP if symptoms not improving within the next 5 days. Return precautions given and patient verbalized understanding.   This chart was dictated using voice recognition software, Dragon. Despite the best efforts of this provider to proofread and correct errors, errors may still occur which can change documentation meaning.       Final diagnoses:  Acute cough  Rib pain on left side    ED Discharge Orders          Ordered    lidocaine  (LIDODERM ) 5 %  Every 24 hours        09/12/24  1530               Veta Palma, PA-C 09/12/24 1537    Patsey Lot, MD 09/13/24 [phone]

## 2024-11-24 ENCOUNTER — Encounter (HOSPITAL_BASED_OUTPATIENT_CLINIC_OR_DEPARTMENT_OTHER): Payer: Self-pay

## 2024-11-24 DIAGNOSIS — R109 Unspecified abdominal pain: Secondary | ICD-10-CM | POA: Insufficient documentation

## 2024-11-24 DIAGNOSIS — R197 Diarrhea, unspecified: Secondary | ICD-10-CM | POA: Insufficient documentation

## 2024-11-24 LAB — URINALYSIS, ROUTINE W REFLEX MICROSCOPIC
Bilirubin Urine: NEGATIVE
Glucose, UA: NEGATIVE mg/dL
Hgb urine dipstick: NEGATIVE
Ketones, ur: NEGATIVE mg/dL
Leukocytes,Ua: NEGATIVE
Nitrite: NEGATIVE
Specific Gravity, Urine: 1.032 — ABNORMAL HIGH (ref 1.005–1.030)
pH: 6.5 (ref 5.0–8.0)

## 2024-11-24 MED ORDER — ONDANSETRON 4 MG PO TBDP: 8.0000 mg | ORAL_TABLET | Freq: Once | ORAL | Status: DC

## 2024-11-24 NOTE — ED Triage Notes (Addendum)
 Pt states he has been having abdominal pain with diarrhea x 2 days  Would also liked to be checked for STDs because he has a new partner Testicular pain No penile discharge

## 2024-11-25 ENCOUNTER — Emergency Department (HOSPITAL_BASED_OUTPATIENT_CLINIC_OR_DEPARTMENT_OTHER)
Admission: EM | Admit: 2024-11-25 | Discharge: 2024-11-25 | Disposition: A | Payer: Self-pay | Attending: Emergency Medicine | Admitting: Emergency Medicine

## 2024-11-25 ENCOUNTER — Telehealth: Payer: Self-pay | Admitting: Physician Assistant

## 2024-11-25 DIAGNOSIS — R197 Diarrhea, unspecified: Secondary | ICD-10-CM

## 2024-11-25 DIAGNOSIS — R849 Unspecified abnormal finding in specimens from respiratory organs and thorax: Secondary | ICD-10-CM

## 2024-11-25 NOTE — Progress Notes (Signed)
" ° °  Thank you for the details you included in the comment boxes. Those details are very helpful in determining the best course of treatment for you and help us  to provide the best care. Because you need to discuss lab results, we recommend that you schedule a Virtual Urgent Care video visit in order for the provider to better assess what is going on.  The provider will be able to give you a more accurate diagnosis and treatment plan if we can more freely discuss your symptoms and with the addition of a virtual examination.   If you change your visit to a video visit, we will bill your insurance (similar to an office visit) and you will not be charged for this e-Visit. You will be able to stay at home and speak with the first available [facility] advanced practice provider. The link to do a video visit is in the drop down Menu tab of your Welcome screen in MyChart.    "

## 2024-11-25 NOTE — ED Provider Notes (Signed)
 " [place] EMERGENCY DEPARTMENT AT [facility] Provider Note   CSN: [id number] Arrival date & time: 11/24/24  2316     Patient presents with: Abdominal Pain   Justin Vance is a 29 y.o. male.   The history is provided by the patient.  Exposure to STD This is a recurrent problem. The problem occurs constantly. The problem has not changed since onset.Pertinent negatives include no chest pain, no headaches and no shortness of breath. Nothing aggravates the symptoms. Nothing relieves the symptoms. He has tried nothing for the symptoms. The treatment provided no relief.  Patient is concerned for STI as he had an unprotected encounter and his partner had diarrhea and he has now had an episode of diarrhea.  No dysuria.  No discharge.  Would like STI testing.       Prior to Admission medications  Medication Sig Start Date End Date Taking? Authorizing Provider  benzonatate  (TESSALON ) 100 MG capsule Take 1 capsule (100 mg total) by mouth 3 (three) times daily as needed. 06/22/22   Vivienne Delon HERO, PA-C  lidocaine  (LIDODERM ) 5 % Place 1 patch onto the skin daily. Remove & Discard patch within 12 hours or as directed by MD 09/12/24   Veta Palma, PA-C  predniSONE  (DELTASONE ) 20 MG tablet Take 2 tablets (40 mg total) by mouth daily with breakfast. 06/22/22   Vivienne Delon HERO, PA-C    Allergies: Patient has no known allergies.    Review of Systems  Constitutional:  Negative for fever.  Respiratory:  Negative for shortness of breath.   Cardiovascular:  Negative for chest pain.  Genitourinary:  Negative for dysuria, flank pain, frequency, genital sores, penile discharge, penile swelling and scrotal swelling.  Neurological:  Negative for headaches.  All other systems reviewed and are negative.   Updated Vital Signs BP 134/79 (BP Location: Right Arm)   Pulse 84   Temp 98.7 F (37.1 C)   Resp 17   Ht 5' 11 (1.803 m)   Wt 63.5 kg   SpO2 99%   BMI 19.53 kg/m   Physical  Exam Vitals and nursing note reviewed.  Constitutional:      General: He is not in acute distress.    Appearance: He is well-developed. He is not diaphoretic.  HENT:     Head: Normocephalic and atraumatic.     Nose: Nose normal.  Eyes:     Conjunctiva/sclera: Conjunctivae normal.     Pupils: Pupils are equal, round, and reactive to light.  Cardiovascular:     Rate and Rhythm: Normal rate and regular rhythm.     Pulses: Normal pulses.     Heart sounds: Normal heart sounds.  Pulmonary:     Effort: Pulmonary effort is normal.     Breath sounds: Normal breath sounds. No wheezing or rales.  Abdominal:     General: Bowel sounds are normal.     Palpations: Abdomen is soft.     Tenderness: There is no abdominal tenderness. There is no guarding or rebound.  Musculoskeletal:        General: Normal range of motion.     Cervical back: Normal range of motion and neck supple.  Skin:    General: Skin is warm and dry.  Neurological:     Mental Status: He is alert and oriented to person, place, and time.     (all labs ordered are listed, but only abnormal results are displayed) Labs Reviewed  URINALYSIS, ROUTINE W REFLEX MICROSCOPIC - Abnormal; Notable for  the following components:      Result Value   Specific Gravity, Urine 1.032 (*)    Protein, ur TRACE (*)    All other components within normal limits  GC/CHLAMYDIA PROBE AMP ([place]) NOT AT [facility]    EKG: None  Radiology: No results found.   Procedures   Medications Ordered in the ED  ondansetron  (ZOFRAN -ODT) disintegrating tablet 8 mg (8 mg Oral Not Given 11/24/24 2330)                                    Medical Decision Making Patient would like STI testing   Amount and/or Complexity of Data Reviewed External Data Reviewed: notes.    Details: Previous notes reviewed  Labs: ordered.    Details: Urine is negative for UTI.  GC and chlamydia sent   Risk Prescription drug management. Risk Details: Patient reports  to me he doesn't have abdominal pain.  Exam and vitals are benign and reassuring.  Urine is without signs of infection.  No penile discharge.  No dysuria.  He just is concerned.  I have explained that diarrhea is not associated with STI.  He is declining rocephin  at this time.  I spent more than 25 minutes discussing this with the patient and he did not like how it made him feel in the past.  He has repeated that he needs to know.  I have politely explained that the tests were sent but can take 3 days to return.  I have repeatedly offered treatment and he has declined.  Stable for discharge.  [facility].       Final diagnoses:  Diarrhea, unspecified type   The patient is nontoxic-appearing on exam and vital signs are within normal limits.  I have reviewed the triage vital signs and the nursing notes. Pertinent labs & imaging results that were available during my care of the patient were reviewed by me and considered in my medical decision making (see chart for details). After history, exam, and medical workup I feel the patient has been appropriately medically screened and is safe for discharge home. Pertinent diagnoses were discussed with the patient. Patient was given return precautions.    ED Discharge Orders     None          Jeremian Whitby, MD 11/25/24 0422  "

## 2024-11-25 NOTE — Progress Notes (Signed)
 Patient wanting to discuss UA results from this morning.   Advised only showing concentrated urine at this time and that he should push fluids.  Also advised that the ED he was seen at will contact him if his GC/chlamydia return positive.  Voiced understanding. All questions answered.  Will No charge since seen at ED this morning and awaiting test results.

## 2024-11-26 LAB — GC/CHLAMYDIA PROBE AMP (~~LOC~~) NOT AT ARMC
Chlamydia: NEGATIVE
Comment: NEGATIVE
Comment: NORMAL
Neisseria Gonorrhea: NEGATIVE
